# Patient Record
Sex: Female | Born: 1940 | Race: White | Hispanic: No | Marital: Married | Smoking: Never smoker
Health system: Southern US, Community
[De-identification: ages and names within clinical notes are randomized; demographics above are authoritative.]

## PROBLEM LIST (undated history)

## (undated) DIAGNOSIS — I10 Essential (primary) hypertension: Secondary | ICD-10-CM

## (undated) DIAGNOSIS — M1990 Unspecified osteoarthritis, unspecified site: Secondary | ICD-10-CM

## (undated) HISTORY — DX: Essential (primary) hypertension: I10

## (undated) HISTORY — DX: Unspecified osteoarthritis, unspecified site: M19.90

---

## 2005-07-08 ENCOUNTER — Encounter: Admission: RE | Admit: 2005-07-08 | Discharge: 2005-07-08 | Payer: Self-pay | Admitting: Orthopedic Surgery

## 2007-12-06 ENCOUNTER — Ambulatory Visit: Payer: Self-pay

## 2009-03-28 ENCOUNTER — Encounter: Admission: RE | Admit: 2009-03-28 | Discharge: 2009-03-28 | Payer: Self-pay | Admitting: Orthopedic Surgery

## 2014-12-05 ENCOUNTER — Ambulatory Visit (INDEPENDENT_AMBULATORY_CARE_PROVIDER_SITE_OTHER): Payer: Medicare Other

## 2014-12-05 VITALS — BP 133/64 | HR 96 | Resp 12

## 2014-12-05 DIAGNOSIS — M79673 Pain in unspecified foot: Secondary | ICD-10-CM

## 2014-12-05 DIAGNOSIS — R52 Pain, unspecified: Secondary | ICD-10-CM | POA: Diagnosis not present

## 2014-12-05 DIAGNOSIS — M7661 Achilles tendinitis, right leg: Secondary | ICD-10-CM

## 2014-12-05 DIAGNOSIS — M722 Plantar fascial fibromatosis: Secondary | ICD-10-CM | POA: Diagnosis not present

## 2014-12-05 DIAGNOSIS — M7730 Calcaneal spur, unspecified foot: Secondary | ICD-10-CM | POA: Diagnosis not present

## 2014-12-05 DIAGNOSIS — M7662 Achilles tendinitis, left leg: Secondary | ICD-10-CM | POA: Diagnosis not present

## 2014-12-05 MED ORDER — PREDNISONE 10 MG PO KIT: PACK | ORAL | Status: AC

## 2014-12-05 NOTE — Progress Notes (Signed)
   Subjective:    Patient ID: Elizabeth Kim, female    DOB: 04-17-1941, 74 y.o.   MRN: [id number]  HPI  PT STATED B/L BACK OF THE HEEL BEEN PAINFUL FOR 2 MONTHS. FEET ARE GETTING WORSE AND GET AGGRAVATED BY WALKING. TRIED TO WEAR GOOD SHOES BUT NO HELP.  Review of Systems  All other systems reviewed and are negative.      Objective:   Physical Exam 74 year old white female options this time with a recurrence or recalcitrant heel pain both inferior and posterior pain however on exam is the inferior pain from mid arch and medial calcaneal tubercle with most significant painful left more so than right. Has had orthotics for at least 3 or more years currently wearing orthotics in the pair of [employer] shoes that fit and contour well however the levels recently and has since developed posses compensatory Achilles tendinitis as well as pain along the medial band of the plantar fascial bilateral. Neurovascular status is intact pedal pulses are palpable DP and PT +2 over 4 bilateral Refill time 3 seconds. Epicritic and proprioceptive sensations appear to be intact and symmetric bilateral there is normal plantar response DTRs not listed dermatologically skin color pigment normal hair growth absent nails criptotic and incurvated orthopedic exam rectus foot type no fractures no cyst or tumors are noted x-rays reveal some mild inferior calcaneal spurring fascial thickening no signs of fracture or other osseous abnormality identified. Again does have a history of plantar fasciitis with exacerbation at this time.       Assessment & Plan:  Assessment plantar fasciitis/heel spur syndrome patient placed on a Sterapred Deas dosepak at this time both foot plantar fasciitis and secondary or compensatory Achilles tendinitis the gait changes. Fascial strapping applied to both feet recheck in 2 weeks for follow-up recommended ice and begin steroid Dosepak is issued  Alvan Dameichard Leovardo Thoman DPM

## 2014-12-05 NOTE — Patient Instructions (Signed)

## 2014-12-19 ENCOUNTER — Ambulatory Visit: Payer: Medicare Other

## 2015-09-23 DIAGNOSIS — J219 Acute bronchiolitis, unspecified: Secondary | ICD-10-CM | POA: Insufficient documentation

## 2015-10-06 DIAGNOSIS — K2900 Acute gastritis without bleeding: Secondary | ICD-10-CM | POA: Insufficient documentation

## 2015-11-27 DIAGNOSIS — I10 Essential (primary) hypertension: Secondary | ICD-10-CM | POA: Insufficient documentation

## 2015-11-27 DIAGNOSIS — K219 Gastro-esophageal reflux disease without esophagitis: Secondary | ICD-10-CM | POA: Insufficient documentation

## 2015-11-27 DIAGNOSIS — E559 Vitamin D deficiency, unspecified: Secondary | ICD-10-CM | POA: Insufficient documentation

## 2015-11-27 DIAGNOSIS — E785 Hyperlipidemia, unspecified: Secondary | ICD-10-CM | POA: Insufficient documentation

## 2016-02-27 ENCOUNTER — Encounter: Payer: Self-pay | Admitting: Podiatry

## 2016-02-27 ENCOUNTER — Ambulatory Visit (INDEPENDENT_AMBULATORY_CARE_PROVIDER_SITE_OTHER): Payer: Medicare Other | Admitting: Podiatry

## 2016-02-27 ENCOUNTER — Ambulatory Visit (INDEPENDENT_AMBULATORY_CARE_PROVIDER_SITE_OTHER): Payer: Medicare Other

## 2016-02-27 VITALS — BP 142/73 | HR 94 | Resp 18

## 2016-02-27 DIAGNOSIS — R52 Pain, unspecified: Secondary | ICD-10-CM

## 2016-02-27 DIAGNOSIS — M779 Enthesopathy, unspecified: Secondary | ICD-10-CM

## 2016-02-27 DIAGNOSIS — M216X2 Other acquired deformities of left foot: Secondary | ICD-10-CM

## 2016-02-27 DIAGNOSIS — M7742 Metatarsalgia, left foot: Secondary | ICD-10-CM | POA: Diagnosis not present

## 2016-02-27 DIAGNOSIS — M7741 Metatarsalgia, right foot: Secondary | ICD-10-CM

## 2016-03-01 NOTE — Progress Notes (Signed)
Patient ID: Marcy SalvoDianna C Hawkes, female   DOB: 1940-12-04, 75 y.o.   MRN: [id number]  Subjective: 75 year old female presents the office as concerns of pain to the ball of her foot which is been ongoing for 10 days his been worsening. She denies any recent injury or trauma to the left foot. She says that she has pain with weightbearing or pressure to her foot. Denies any warmth or redness although she does have a localized area of swelling to the ball of her foot for which she points to submetatarsal 3 and the left side. She did have an injury about 10 days ago in which she fell cutting her right leg [place] [facility] care for that with sutures were placed. She was on doxycycline. She states the redness to the area has improved. She's been following her primary care physician. She'll come back to the urgent care next week for suture removal. Denies any systemic complaints such as fevers, chills, nausea, vomiting. No acute changes since last appointment, and no other complaints at this time. Notingling to the toes.  Objective: AAO x3, NAD DP/PT pulses palpable bilaterally, CRT less than 3 seconds On the left foot submetatarsal 3 is left third interspaces tenderness and there is localized edema along submetatarsal 3. There is no pain in the dorsal aspect of the metatarsals. There is no pain with MPJ range of motion. No other areas of tenderness bilaterally. On the right posterior leg is Davis laceration with sutures intact. There is apparently resolving erythema around this lesion. No drainage or pus. No edema. No areas of pinpoint bony tenderness or pain with vibratory sensation. MMT 5/5, ROM WNL. No edema, erythema, increase in warmth to bilateral lower extremities.  No open lesions or pre-ulcerative lesions.  No pain with calf compression, swelling, warmth, erythema  Assessment: Left foot submetatarsal 3 pain likely due to capsulitis, metatarsalgia.  Plan: -All treatment options discussed with the patient  including all alternatives, risks, complications.  -X-rays were obtained and reviewed. No definitive evidence of acute fracture or stress fracture.-Discussed steroid injection for which she wishes to proceed with. Under sterile conditions a mixture of Kenalog as well as local anesthetic was infiltrated to the area of maximal tenderness without complications. Post injection care was discussed. -Offloading pads. -Ice. -Continue to follow-up with primary care physician/urgent care for right leg laceration. I discussed that if she wishes for me to remove the sutures next week we would happy to do so. Monitor for signs or symptoms of infection.  Ovid CurdMatthew Jakaiya Netherland, DPM

## 2016-03-22 ENCOUNTER — Ambulatory Visit: Payer: Medicare Other | Admitting: Podiatry

## 2016-03-22 ENCOUNTER — Encounter: Payer: Self-pay | Admitting: Podiatry

## 2016-03-22 ENCOUNTER — Ambulatory Visit (INDEPENDENT_AMBULATORY_CARE_PROVIDER_SITE_OTHER): Payer: Medicare Other | Admitting: Podiatry

## 2016-03-22 VITALS — BP 156/73 | HR 91 | Resp 18

## 2016-03-22 DIAGNOSIS — M7742 Metatarsalgia, left foot: Secondary | ICD-10-CM | POA: Diagnosis not present

## 2016-03-22 DIAGNOSIS — S81811S Laceration without foreign body, right lower leg, sequela: Secondary | ICD-10-CM | POA: Diagnosis not present

## 2016-03-22 DIAGNOSIS — M7741 Metatarsalgia, right foot: Secondary | ICD-10-CM

## 2016-03-22 DIAGNOSIS — M779 Enthesopathy, unspecified: Secondary | ICD-10-CM | POA: Diagnosis not present

## 2016-03-25 NOTE — Progress Notes (Signed)
Patient ID: Elizabeth Kim, female   DOB: September 23, 1941, 75 y.o.   MRN: [id number]  Subjective: 75 year old female presents the office in follow-up evaluation of the left foot pain. She states the left foot pain is doing substantially better compared to last appointment. She has developed some pain in the back of her left heel rubs in her shoes. Denies any recent injury. No swelling or redness. She also had a laceration the right leg for which she was seen at urgent care for. She states that the areas is not healing as she expected. She just finished a course of Bactrim and she had some posturing from the area while [place] is at the beach. Since taking the antibiotics she states the redness and the drainage has stopped. She still gets some discomfort to the incision at times. No other complaints.   Objective: AAO x3, NAD DP/PT pulses palpable bilaterally, CRT less than 3 seconds At this time there is no tenderness to palpation of the left foot submetatarsal area. No edema, erythema. There is mild discomfort along the posterior aspect of the calcaneus overlying the small bone spur. There is no edema, erythema. There is no pain on the course of the Achilles tendon was no defect noted within the tendon. Laceration present on the posterior aspect the left leg on the calf. Laceration appears to be healed except for the posterior portion. There is no drainage or pus. There is no induration. No swelling redness or red streaks. No areas of pinpoint bony tenderness or pain with vibratory sensation. MMT 5/5, ROM WNL. No edema, erythema, increase in warmth to bilateral lower extremities.  No open lesions or pre-ulcerative lesions.  No pain with calf compression, swelling, warmth, erythema  Assessment: Laceration left leg, resolved forefoot pain left foot, left posterior calcaneal bursitis  Plan: -All treatment options discussed with the patient including all alternatives, risks, complications.  -For the left leg  recommended antibiotic ointment dressing changes daily. Currently there is no clinical signs of infection and will hold off on further antibiotics. If not healed in 2 weeks to call the office. -Left forefoot pain resolved. Discussed offloading to help prevent recurrence -Posterior gel sleeve applied for the bone spur. Stretching exercises. Achilles. Discussed shoe gear changes. -Patient encouraged to call the office with any questions, concerns, change in symptoms.   Ovid CurdMatthew Wagoner, DPM

## 2016-05-03 ENCOUNTER — Ambulatory Visit: Payer: Medicare Other | Admitting: Podiatry

## 2016-07-07 DIAGNOSIS — E669 Obesity, unspecified: Secondary | ICD-10-CM | POA: Insufficient documentation

## 2016-07-07 DIAGNOSIS — F3342 Major depressive disorder, recurrent, in full remission: Secondary | ICD-10-CM | POA: Insufficient documentation

## 2016-07-07 DIAGNOSIS — Z13820 Encounter for screening for osteoporosis: Secondary | ICD-10-CM | POA: Insufficient documentation

## 2016-08-04 ENCOUNTER — Ambulatory Visit (INDEPENDENT_AMBULATORY_CARE_PROVIDER_SITE_OTHER): Payer: Medicare Other | Admitting: Physical Medicine and Rehabilitation

## 2016-08-19 ENCOUNTER — Ambulatory Visit (INDEPENDENT_AMBULATORY_CARE_PROVIDER_SITE_OTHER): Payer: Medicare Other | Admitting: Physical Medicine and Rehabilitation

## 2016-08-19 ENCOUNTER — Encounter (INDEPENDENT_AMBULATORY_CARE_PROVIDER_SITE_OTHER): Payer: Self-pay | Admitting: Physical Medicine and Rehabilitation

## 2016-08-19 VITALS — BP 177/82 | HR 79

## 2016-08-19 DIAGNOSIS — M5416 Radiculopathy, lumbar region: Secondary | ICD-10-CM | POA: Diagnosis not present

## 2016-08-19 DIAGNOSIS — M961 Postlaminectomy syndrome, not elsewhere classified: Secondary | ICD-10-CM | POA: Diagnosis not present

## 2016-08-19 NOTE — Progress Notes (Signed)
Office Visit Note   Patient: Elizabeth Kim           Date of Birth: [birth year]           MRN: [id number] Visit Date: 08/19/2016              Requested by: Elizabeth EvesYuri M. Luiz Ironabeza, Elizabeth Kim [address] [place], [place] [id number] PCP: Elizabeth Kim,YURI, Elizabeth Kim   Assessment & Plan: Visit Diagnoses:  1. Post laminectomy syndrome   2. Lumbar radiculopathy    Chronic history of neck and low back pain with prior lumbar lateral fusion which was done a long time ago. She has had an exacerbation now right radicular leg pain and some bursitis of the right greater trochanter. I think there predominantly her pain as an L4-L5 distribution. There is really no other red flag complaints or symptoms. She has good distal strength. I think the best approach would be a diagnostic L4-L5 transforaminal injection. To do that weaning her off her Plavix for 5-7 days. Will get clearance for that. She's done well in the past with epidural injections. She can't take anti-inflammatories due to Plavix anticoagulation. Depending on the relief with that we would updated MRI if a distant help very much versus looking at a greater trochanteric injection. We've also given her some home exercises as well as using a rolling pin for massaging the tensor fascia lata and IT band. Plan: We will schedule her for a right L4-L5 transforaminal epidural injection once we have glanced off Plavix. This would be diagnostic and hopefully therapeutic.  Follow-Up Instructions: Return for Schedule Right L5 transforaminal esi off plaviz.   Orders:  No orders of the defined types were placed in this encounter.  No orders of the defined types were placed in this encounter.     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 She has a transitional segment with a lumbarized S1. She had some foraminal narrowing at L4. Facet arthropathy  at L3-4.    Subjective: Chief Complaint  Patient presents with  . Left Hip - Pain    Hip Pain   The pain is present in the right hip, right thigh and right knee. The pain is at a severity of 8/10. The pain has been constant since onset. The symptoms are aggravated by weight bearing and movement. She has tried acetaminophen for the symptoms. The treatment provided mild relief.  Elizabeth Kim is a 75 year old female that we seen on numerous occasions but I have not seen her since December last year. At that point we completed a facet joint block above her fusion with good relief of some axial low back pain. She now comes in having had right hip and leg pain since September. Thinks she may have aggravated it lifting something. Worse with standing. Pain goes from right buttock down past right knee. Denies groin pain. Denies numbness and tingling. She does describe this as a deep aching pain. She does have some pain over the greater trochanter. She is able to sleep at night. She is taking Tylenol. She cannot take any anti-inflammatories due to anticoagulants.  She was walking for exercise but has not been able to walk since September.  Review of Systems  Constitutional: Negative for chills, fatigue, fever and unexpected weight change.  HENT: Negative for sore throat and trouble swallowing.   Eyes: Negative for photophobia  and visual disturbance.  Respiratory: Negative for chest tightness and shortness of breath.   Cardiovascular: Negative for chest pain.  Gastrointestinal: Negative for abdominal pain.  Endocrine: Negative for cold intolerance and heat intolerance.  Musculoskeletal: Negative for myalgias.  Skin: Negative for color change and rash.  Neurological: Negative for speech difficulty and headaches.  Psychiatric/Behavioral: Negative for confusion. The patient is not nervous/anxious.   All other systems reviewed and are negative.    Objective: Vital Signs: BP (!) 177/82   Pulse 79    Physical Exam  Constitutional: She is oriented to person, place, and time. She appears well-developed and well-nourished. No distress.  HENT:  Head: Normocephalic and atraumatic.  Nose: Nose normal.  Mouth/Throat: Oropharynx is clear and moist.  Eyes: Conjunctivae are normal. Pupils are equal, round, and reactive to light.  Neck: Normal range of motion. Neck supple.  Cardiovascular: Regular rhythm and intact distal pulses.   Pulmonary/Chest: Effort normal and breath sounds normal.  Abdominal: She exhibits no distension.  Neurological: She is alert and oriented to person, place, and time.  Skin: Skin is warm.  Psychiatric: She has a normal mood and affect. Her behavior is normal.  Nursing note and vitals reviewed.  The patient ambulates without aid with a normal gait.  They have no pain with hip internal or external rotation.  There is concordant low back pain with extension rotation of the lumbar spine. They have  pain over the greater trochanters and PSIS.  On manual muscle testing there is 5/5 strength in all muscle groups of the lower extremities bilaterally without deficits.   There is no clonus bilaterally.  Slump test is negative bilaterally.  Ortho Exam  Specialty Comments:  No specialty comments available.  Imaging: No results found.   PMFS History: There are no active problems to display for this patient.  Past Medical History:  Diagnosis Date  . Arthritis   . Hypertension     History reviewed. No pertinent family history.  History reviewed. No pertinent surgical history. Social History   Occupational History  . Not on file.   Social History Main Topics  . Smoking status: Never Smoker  . Smokeless tobacco: Never Used  . Alcohol use No  . Drug use: No  . Sexual activity: Not on file

## 2016-08-31 ENCOUNTER — Ambulatory Visit (INDEPENDENT_AMBULATORY_CARE_PROVIDER_SITE_OTHER): Payer: Medicare Other | Admitting: Physical Medicine and Rehabilitation

## 2016-09-01 ENCOUNTER — Encounter (INDEPENDENT_AMBULATORY_CARE_PROVIDER_SITE_OTHER): Payer: Self-pay | Admitting: Physical Medicine and Rehabilitation

## 2016-09-01 ENCOUNTER — Ambulatory Visit (INDEPENDENT_AMBULATORY_CARE_PROVIDER_SITE_OTHER): Payer: Medicare Other | Admitting: Physical Medicine and Rehabilitation

## 2016-09-01 VITALS — BP 166/78 | HR 64 | Temp 98.3°F

## 2016-09-01 DIAGNOSIS — M5416 Radiculopathy, lumbar region: Secondary | ICD-10-CM | POA: Diagnosis not present

## 2016-09-01 DIAGNOSIS — M961 Postlaminectomy syndrome, not elsewhere classified: Secondary | ICD-10-CM | POA: Insufficient documentation

## 2016-09-01 MED ORDER — LIDOCAINE HCL (PF) 1 % IJ SOLN
0.3300 mL | Freq: Once | INTRAMUSCULAR | Status: AC
  Administered 2016-09-01: 0.3 mL

## 2016-09-01 MED ORDER — METHYLPREDNISOLONE ACETATE 80 MG/ML IJ SUSP
80.0000 mg | Freq: Once | INTRAMUSCULAR | Status: AC
  Administered 2016-09-01: 80 mg

## 2016-09-01 NOTE — Procedures (Signed)
Lumbosacral Transforaminal Epidural Steroid Injection - Infraneural Approach with Fluoroscopic Guidance  Patient: Elizabeth Kim      Date of Birth: 04-Nov-1940 MRN: [id number] PCP: Dennis BastABEZA,YURI, MD      Visit Date: 09/01/2016   Universal Protocol:    Date/Time: 11/15/172:51 PM  Consent Given By: the patient  Position: PRONE   Additional Comments: Vital signs were monitored before and after the procedure. Patient was prepped and draped in the usual sterile fashion. The correct patient, procedure, and site was verified.   Injection Procedure Details:  Procedure Site One Meds Administered:  Meds ordered this encounter  Medications  . lidocaine (PF) (XYLOCAINE) 1 % injection 0.3 mL  . methylPREDNISolone acetate (DEPO-MEDROL) injection 80 mg      Laterality: Right  Location/Site:  L4-L5  Needle size: 22 G  Needle type: Spinal  Needle Placement: Transforaminal  Findings:  -Contrast Used: 1 mL iohexol 180 mg iodine/mL   -Comments: Excellent flow of contrast along the nerve and into the epidural space.  Procedure Details: After squaring off the end-plates of the desired vertebral level to get a true AP view, the C-arm was obliqued to the painful side so that the superior articulating process is positioned about 1/3 the length of the inferior endplate.  The needle was aimed toward the junction of the superior articular process and the transverse process of the inferior vertebrae. The needle's initial entry is in the lower third of the foramen through Kambin's triangle. The soft tissues overlying this target were infiltrated with 2-3 ml. of 1% Lidocaine without Epinephrine.  The spinal needle was then inserted and advanced toward the target using a "trajectory" view along the fluoroscope beam.  Under AP and lateral visualization, the needle was advanced so it did not puncture dura and did not traverse medially beyond the 6 o'clock position of the pedicle. Bi-planar projections  were used to confirm position. Aspiration was confirmed to be negative for CSF and/or blood. A 1-2 ml. volume of Isovue-250 was injected and flow of contrast was noted at each level. Radiographs were obtained for documentation purposes.   After attaining the desired flow of contrast documented above, a 0.5 to 1.0 ml test dose of 0.25% Marcaine was injected into each respective transforaminal space.  The patient was observed for 90 seconds post injection.  After no sensory deficits were reported, and normal lower extremity motor function was noted,   the above injectate was administered so that equal amounts of the injectate were placed at each foramen (level) into the transforaminal epidural space.   Additional Comments:  The patient tolerated the procedure well Dressing: Band-Aid    Post-procedure details: Patient was observed during the procedure. Post-procedure instructions were reviewed.  Patient left the clinic in stable condition.

## 2016-09-01 NOTE — Patient Instructions (Signed)

## 2016-09-01 NOTE — Progress Notes (Signed)
Elizabeth SalvoDianna C Sheffler - 75 y.o. female MRN [id number]  Date of birth: 23-May-1941  Office Visit Note: Visit Date: 09/01/2016 PCP: Dennis BastABEZA,YURI, MD Referred by: Andreas Blowerabeza, Yuri M., MD  Subjective: Chief Complaint  Patient presents with  . Lower Back - Pain   HPI: Elizabeth Kim is a 75 year old female with right buttock and posterior lateral thigh pain down to the knee. His more of an L4-L5 distribution. Patient here today for planned right L4-5 transforaminal injection. No change in symptoms.  Stopped taking Plavix on 08/24/16    ROS Otherwise per HPI.  Assessment & Plan: Visit Diagnoses:  1. Lumbar radiculopathy   2. Post laminectomy syndrome     Plan: Findings:  I am going to complete a right L4 transforaminal injection. She's had prior lateral fusion at L5-S1. She has an S1 lumbar last segment. She'll return to taking her Plavix in the morning.    Meds & Orders:  Meds ordered this encounter  Medications  . lidocaine (PF) (XYLOCAINE) 1 % injection 0.3 mL  . methylPREDNISolone acetate (DEPO-MEDROL) injection 80 mg    Orders Placed This Encounter  Procedures  . Epidural Steroid injection    Follow-up: Return if symptoms worsen or fail to improve, for ? new Lspine MRI.   Procedures: No procedures performed  Lumbosacral Transforaminal Epidural Steroid Injection - Infraneural Approach with Fluoroscopic Guidance  Patient: Elizabeth Kim      Date of Birth: 23-May-1941 MRN: [id number] PCP: Dennis BastABEZA,YURI, MD      Visit Date: 09/01/2016   Universal Protocol:    Date/Time: 11/15/172:51 PM  Consent Given By: the patient  Position: PRONE   Additional Comments: Vital signs were monitored before and after the procedure. Patient was prepped and draped in the usual sterile fashion. The correct patient, procedure, and site was verified.   Injection Procedure Details:  Procedure Site One Meds Administered:  Meds ordered this encounter  Medications  . lidocaine (PF) (XYLOCAINE) 1 %  injection 0.3 mL  . methylPREDNISolone acetate (DEPO-MEDROL) injection 80 mg      Laterality: Right  Location/Site:  L4-L5  Needle size: 22 G  Needle type: Spinal  Needle Placement: Transforaminal  Findings:  -Contrast Used: 1 mL iohexol 180 mg iodine/mL   -Comments: Excellent flow of contrast along the nerve and into the epidural space.  Procedure Details: After squaring off the end-plates of the desired vertebral level to get a true AP view, the C-arm was obliqued to the painful side so that the superior articulating process is positioned about 1/3 the length of the inferior endplate.  The needle was aimed toward the junction of the superior articular process and the transverse process of the inferior vertebrae. The needle's initial entry is in the lower third of the foramen through Kambin's triangle. The soft tissues overlying this target were infiltrated with 2-3 ml. of 1% Lidocaine without Epinephrine.  The spinal needle was then inserted and advanced toward the target using a "trajectory" view along the fluoroscope beam.  Under AP and lateral visualization, the needle was advanced so it did not puncture dura and did not traverse medially beyond the 6 o'clock position of the pedicle. Bi-planar projections were used to confirm position. Aspiration was confirmed to be negative for CSF and/or blood. A 1-2 ml. volume of Isovue-250 was injected and flow of contrast was noted at each level. Radiographs were obtained for documentation purposes.   After attaining the desired flow of contrast documented above, a 0.5 to 1.0 ml test dose  of 0.25% Marcaine was injected into each respective transforaminal space.  The patient was observed for 90 seconds post injection.  After no sensory deficits were reported, and normal lower extremity motor function was noted,   the above injectate was administered so that equal amounts of the injectate were placed at each foramen (level) into the transforaminal  epidural space.   Additional Comments:  The patient tolerated the procedure well Dressing: Band-Aid    Post-procedure details: Patient was observed during the procedure. Post-procedure instructions were reviewed.  Patient left the clinic in stable condition.   Clinical History: No specialty comments available.  She reports that she has never smoked. She has never used smokeless tobacco. No results for input(s): HGBA1C, LABURIC in the last 8760 hours.  Objective:  VS:  HT:    WT:   BMI:     BP:(!) 166/78  HR:64bpm  TEMP:98.3 F (36.8 C)(Oral)  RESP:96 % Physical Exam  Musculoskeletal:  The patient ambulates without aid and has good distal strength bilaterally without deficits.    Ortho Exam Imaging: No results found.  Past Medical/Family/Surgical/Social History: Medications & Allergies reviewed per EMR Patient Active Problem List   Diagnosis Date Noted  . Lumbar radiculopathy 09/01/2016  . Post laminectomy syndrome 09/01/2016   Past Medical History:  Diagnosis Date  . Arthritis   . Hypertension    History reviewed. No pertinent family history. History reviewed. No pertinent surgical history. Social History   Occupational History  . Not on file.   Social History Main Topics  . Smoking status: Never Smoker  . Smokeless tobacco: Never Used  . Alcohol use No  . Drug use: No  . Sexual activity: Not on file

## 2017-04-04 DIAGNOSIS — G43009 Migraine without aura, not intractable, without status migrainosus: Secondary | ICD-10-CM | POA: Insufficient documentation

## 2017-08-23 ENCOUNTER — Telehealth (INDEPENDENT_AMBULATORY_CARE_PROVIDER_SITE_OTHER): Payer: Self-pay | Admitting: Physical Medicine and Rehabilitation

## 2017-08-23 NOTE — Telephone Encounter (Signed)
Repeat ok vs. bilateral

## 2017-08-24 NOTE — Telephone Encounter (Signed)
Patient said pain is different than what she has had in the past and is on the left side. I scheduled her for an ov.

## 2017-08-25 ENCOUNTER — Encounter (INDEPENDENT_AMBULATORY_CARE_PROVIDER_SITE_OTHER): Payer: Self-pay | Admitting: Physical Medicine and Rehabilitation

## 2017-08-25 ENCOUNTER — Ambulatory Visit (INDEPENDENT_AMBULATORY_CARE_PROVIDER_SITE_OTHER): Payer: Medicare Other | Admitting: Physical Medicine and Rehabilitation

## 2017-08-25 VITALS — BP 150/74 | HR 72

## 2017-08-25 DIAGNOSIS — M5442 Lumbago with sciatica, left side: Secondary | ICD-10-CM

## 2017-08-25 DIAGNOSIS — M961 Postlaminectomy syndrome, not elsewhere classified: Secondary | ICD-10-CM | POA: Diagnosis not present

## 2017-08-25 DIAGNOSIS — R109 Unspecified abdominal pain: Secondary | ICD-10-CM | POA: Insufficient documentation

## 2017-08-25 MED ORDER — METHYLPREDNISOLONE ACETATE 80 MG/ML IJ SUSP
40.0000 mg | Freq: Once | INTRAMUSCULAR | Status: AC
  Administered 2017-08-25: 40 mg via INTRAMUSCULAR

## 2017-08-25 NOTE — Procedures (Signed)
Trigger Point Injection  Patient: Elizabeth Kim      Date of Birth: 12-31-1940 MRN: [id number] PCP: Andreas Blowerabeza, Yuri M., MD      Visit Date: 08/25/2017   Universal Protocol:    Date/Time: 11/08/189:24 AM  Consent Given By: Patient  Additional Comments:  Patient was prepped and draped in the usual sterile fashion. The correct patient, procedure, and site was verified.   Injection Procedure Details:  Procedure Site One Meds Administered:  Meds ordered this encounter  Medications  . methylPREDNISolone acetate (DEPO-MEDROL) injection 40 mg     Needle size: 25 gauge  Needle Placement: Intramuscular  Procedure Details: A  needle was introduced with an appropriate approach into the palpated trigger points of the muscles noted above in body of note .  After confirmation of negative blood and air aspiration, the above solution was injected freely without resistance and a needling technique was utilized.   Additional Comments:   Patient tolerated procedure well without complication.  Dressing: Band-aid  Post-procedure details: Patient was observed during the procedure. Post-procedure instructions were reviewed.  Patient left the clinic in stable condition.

## 2017-08-25 NOTE — Progress Notes (Deleted)
Burning pain on left side of lower back with sitting, standing. Started about 2 weeks ago.  Had some left leg pain recently.

## 2017-08-25 NOTE — Progress Notes (Signed)
Marcy SalvoDianna C Ricchio - 76 y.o. female MRN [id number]  Date of birth: 05-Feb-1941  Office Visit Note: Visit Date: 08/25/2017 PCP: Andreas Blowerabeza, Yuri M., MD Referred by: Andreas Blowerabeza, Yuri M., MD  Subjective: Chief Complaint  Patient presents with  . Lower Back - Pain, Numbness  . Right Hip - Numbness  . Right Leg - Pain   HPI: Mrs. Tiburcio PeaHarris is a very pleasant 76 year old female that I have seen over intermittent epidural injection usually with more leg pain.  Her history is that she has had a remote L5-S1 lateral fusion surgery.  She has a transitional S1 segment which is lumbarized.  She comes in today with burning pain on the left side of the lower back with both sitting and standing.  Is been going on for a couple of weeks with severity but really over the last several months it has been ongoing.  No specific falls.  She has had some leg pain recently but not severe.  No numbness tingling or paresthesia.  No focal weakness.  Last time we saw her was about a year ago in November.  At that time we did complete epidural injection with decent relief.  Last MRI was from 2006 and is reviewed again below.  She has had no fevers chills or night sweats.  No bowel or bladder changes.  She has had no unintended weight loss.  She is on chronic anticoagulation.  Her case is complicated by history of depression and anxiety.  She has been taking recently Tylenol and some pain medication.  Symptoms are worse with both standing and sitting.  Gets a little bit of relief walking but cannot walk far without it hurting.  Standing is fairly problematic.  She reports that the symptoms are progressive.    Review of Systems  Constitutional: Negative for chills, fever, malaise/fatigue and weight loss.  HENT: Negative for hearing loss and sinus pain.   Eyes: Negative for blurred vision, double vision and photophobia.  Respiratory: Negative for cough and shortness of breath.   Cardiovascular: Negative for chest pain, palpitations and leg  swelling.  Gastrointestinal: Negative for abdominal pain, nausea and vomiting.  Genitourinary: Negative for flank pain.  Musculoskeletal: Positive for back pain and joint pain. Negative for myalgias.  Skin: Negative for itching and rash.  Neurological: Negative for tremors, focal weakness and weakness.  Endo/Heme/Allergies: Negative.   Psychiatric/Behavioral: Negative for depression.  All other systems reviewed and are negative.  Otherwise per HPI.  Assessment & Plan: Visit Diagnoses:  1. Acute left-sided low back pain with left-sided sciatica   2. Post laminectomy syndrome     Plan: Findings:  Chronic history of low back pain status post remote lumbar fusion without instrumentation.  This was a lateral fusion.  She also has a lumbarized S1 segment below the fusion.  Prior MRI was from 2006.  Her current pain is a mixture of chronic on acute.  She does have a focal trigger point in the quadratus lumborum area and possibly where the latissimus dorsi ties into the lower back.  This did reproduce some of her pain.  Trigger point injection was provided.  I do want to update her MRI before we choose to go any further with intervention or treatment.  The MRI would be to look at any adjacent level worsening over the years since 2006.  We only see her about once a year for follow-up usually an injection seems to help or at least this is reported by the patient.  She has no red flag symptoms and no strength loss on exam.    Meds & Orders:  Meds ordered this encounter  Medications  . methylPREDNISolone acetate (DEPO-MEDROL) injection 40 mg    Orders Placed This Encounter  Procedures  . Nerve Block  . MR LUMBAR SPINE WO CONTRAST    Follow-up: Return for MRI review after completion.   Procedures: No procedures performed  Trigger Point Injection  Patient: Marcy SalvoDianna C Fatula      Date of Birth: 1941-07-01 MRN: [id number] PCP: Andreas Blowerabeza, Yuri M., MD      Visit Date: 08/25/2017   Universal  Protocol:    Date/Time: 11/08/189:24 AM  Consent Given By: Patient  Additional Comments:  Patient was prepped and draped in the usual sterile fashion. The correct patient, procedure, and site was verified.   Injection Procedure Details:  Procedure Site One Meds Administered:  Meds ordered this encounter  Medications  . methylPREDNISolone acetate (DEPO-MEDROL) injection 40 mg     Needle size: 25 gauge  Needle Placement: Intramuscular  Procedure Details: A  needle was introduced with an appropriate approach into the palpated trigger points of the muscles noted above in body of note .  After confirmation of negative blood and air aspiration, the above solution was injected freely without resistance and a needling technique was utilized.   Additional Comments:   Patient tolerated procedure well without complication.  Dressing: Band-aid  Post-procedure details: Patient was observed during the procedure. Post-procedure instructions were reviewed.  Patient left the clinic in stable condition.   Clinical History: No specialty comments available.  She reports that  has never smoked. she has never used smokeless tobacco. No results for input(s): HGBA1C, LABURIC in the last 8760 hours.  Objective:  VS:  HT:    WT:   BMI:     BP:(!) 150/74  HR:72bpm  TEMP: ( )  RESP:  Physical Exam  Constitutional: She is oriented to person, place, and time. She appears well-developed and well-nourished. No distress.  HENT:  Head: Normocephalic and atraumatic.  Nose: Nose normal.  Mouth/Throat: Oropharynx is clear and moist.  Eyes: Conjunctivae are normal. Pupils are equal, round, and reactive to light.  Neck: Normal range of motion. Neck supple.  Cardiovascular: Regular rhythm and intact distal pulses.  Pulmonary/Chest: Effort normal. No respiratory distress.  Abdominal: She exhibits no distension. There is no guarding.  Musculoskeletal:  Patient walks with a forward flexed spine.   She has stiffness and pain with extension rotation of the lumbar spine.  There is a focal trigger point on the left which does reproduce some of her pain.  It is in the quadratus lumborum area just medial to the left iliac crest about the L4 level.  She has no pain with hip rotation.  She has good distal strength without clonus.  Neurological: She is alert and oriented to person, place, and time. She exhibits normal muscle tone. Coordination normal.  Skin: Skin is warm. No rash noted. No erythema.  Psychiatric: She has a normal mood and affect. Her behavior is normal.  Nursing note and vitals reviewed.   Ortho Exam Imaging: No results found.  Past Medical/Family/Surgical/Social History: Medications & Allergies reviewed per EMR Patient Active Problem List   Diagnosis Date Noted  . Left sided abdominal pain 08/25/2017  . Migraine without aura and without status migrainosus, not intractable 04/04/2017  . Lumbar radiculopathy 09/01/2016  . Post laminectomy syndrome 09/01/2016  . Osteoporosis screening 07/07/2016  . Obesity (BMI 30.0-34.9) 07/07/2016  .  Recurrent major depressive disorder, in full remission (HCC) 07/07/2016  . Gastroesophageal reflux disease without esophagitis 11/27/2015  . Hyperlipidemia LDL goal <130 11/27/2015  . Vitamin D deficiency 11/27/2015  . Acute gastritis 10/06/2015  . Acute bronchiolitis with bronchospasm 09/23/2015   Past Medical History:  Diagnosis Date  . Arthritis   . Hypertension    History reviewed. No pertinent family history. History reviewed. No pertinent surgical history. Social History   Occupational History  . Not on file  Tobacco Use  . Smoking status: Never Smoker  . Smokeless tobacco: Never Used  Substance and Sexual Activity  . Alcohol use: No    Alcohol/week: 0.0 oz  . Drug use: No  . Sexual activity: Not on file

## 2017-09-01 ENCOUNTER — Encounter (INDEPENDENT_AMBULATORY_CARE_PROVIDER_SITE_OTHER): Payer: Self-pay | Admitting: Physical Medicine and Rehabilitation

## 2017-09-21 ENCOUNTER — Ambulatory Visit
Admission: RE | Admit: 2017-09-21 | Discharge: 2017-09-21 | Disposition: A | Discharge status: Home / Self Care | Payer: Medicare Other | Source: Ambulatory Visit | Attending: Physical Medicine and Rehabilitation | Admitting: Physical Medicine and Rehabilitation

## 2017-09-21 DIAGNOSIS — M961 Postlaminectomy syndrome, not elsewhere classified: Secondary | ICD-10-CM

## 2017-09-21 DIAGNOSIS — M5442 Lumbago with sciatica, left side: Secondary | ICD-10-CM

## 2017-09-29 ENCOUNTER — Encounter (INDEPENDENT_AMBULATORY_CARE_PROVIDER_SITE_OTHER): Payer: Self-pay | Admitting: Physical Medicine and Rehabilitation

## 2017-09-29 ENCOUNTER — Ambulatory Visit (INDEPENDENT_AMBULATORY_CARE_PROVIDER_SITE_OTHER): Payer: Medicare Other | Admitting: Physical Medicine and Rehabilitation

## 2017-09-29 VITALS — BP 158/72 | HR 73

## 2017-09-29 DIAGNOSIS — G8929 Other chronic pain: Secondary | ICD-10-CM

## 2017-09-29 DIAGNOSIS — M545 Low back pain, unspecified: Secondary | ICD-10-CM

## 2017-09-29 DIAGNOSIS — M47816 Spondylosis without myelopathy or radiculopathy, lumbar region: Secondary | ICD-10-CM | POA: Diagnosis not present

## 2017-09-29 DIAGNOSIS — M961 Postlaminectomy syndrome, not elsewhere classified: Secondary | ICD-10-CM

## 2017-09-29 NOTE — Progress Notes (Deleted)
Here for MRI review. Pain has improved. Still "catches" when standing for an extended period and "takes her breath."

## 2017-10-31 ENCOUNTER — Encounter (INDEPENDENT_AMBULATORY_CARE_PROVIDER_SITE_OTHER): Payer: Self-pay | Admitting: Physical Medicine and Rehabilitation

## 2017-10-31 ENCOUNTER — Ambulatory Visit (INDEPENDENT_AMBULATORY_CARE_PROVIDER_SITE_OTHER): Payer: Medicare Other

## 2017-10-31 ENCOUNTER — Ambulatory Visit (INDEPENDENT_AMBULATORY_CARE_PROVIDER_SITE_OTHER): Payer: Medicare Other | Admitting: Physical Medicine and Rehabilitation

## 2017-10-31 VITALS — BP 153/69 | HR 68 | Temp 97.8°F

## 2017-10-31 DIAGNOSIS — G8929 Other chronic pain: Secondary | ICD-10-CM | POA: Diagnosis not present

## 2017-10-31 DIAGNOSIS — M545 Low back pain, unspecified: Secondary | ICD-10-CM

## 2017-10-31 DIAGNOSIS — M961 Postlaminectomy syndrome, not elsewhere classified: Secondary | ICD-10-CM | POA: Diagnosis not present

## 2017-10-31 DIAGNOSIS — M47816 Spondylosis without myelopathy or radiculopathy, lumbar region: Secondary | ICD-10-CM

## 2017-10-31 MED ORDER — METHYLPREDNISOLONE ACETATE 80 MG/ML IJ SUSP
80.0000 mg | Freq: Once | INTRAMUSCULAR | Status: AC
  Administered 2017-10-31: 80 mg

## 2017-10-31 MED ORDER — METHYLPREDNISOLONE ACETATE 80 MG/ML IJ SUSP: 80.0000 mg | Freq: Once | INTRAMUSCULAR | Status: DC

## 2017-10-31 NOTE — Procedures (Signed)
Elizabeth Kim comes in today for planned bilateral L4-5 facet joint injections.  Please see our prior evaluation notes for further details and justification.  She is having mostly axial low back pain bilateral left more than right.  She has had fusion at L5-S1 with grade 2-3 listhesis at this level.  She has a transitional S1 segment.  Lumbar Facet Joint Intra-Articular Injection(s) with Fluoroscopic Guidance  Patient: Elizabeth Kim      Date of Birth: 1941/06/11 MRN: [id number] PCP: Andreas Blower., MD      Visit Date: 10/31/2017   Universal Protocol:    Date/Time: 10/31/2017  Consent Given By: the patient  Position: PRONE   Additional Comments: Vital signs were monitored before and after the procedure. Patient was prepped and draped in the usual sterile fashion. The correct patient, procedure, and site was verified.   Injection Procedure Details:  Procedure Site One Meds Administered:  Meds ordered this encounter  Medications  . DISCONTD: methylPREDNISolone acetate (DEPO-MEDROL) injection 80 mg  . methylPREDNISolone acetate (DEPO-MEDROL) injection 80 mg     Laterality: Bilateral  Location/Site:  Based on transitional S1 segment. L4-L5  Needle size: 22 guage  Needle type: Spinal  Needle Placement: Articular  Findings:  -Comments: Excellent flow of contrast producing a partial arthrogram.  Procedure Details: The fluoroscope beam is vertically oriented in AP, and the inferior recess is visualized beneath the lower pole of the inferior apophyseal process, which represents the target point for needle insertion. When direct visualization is difficult the target point is located at the medial projection of the vertebral pedicle. The region overlying each aforementioned target is locally anesthetized with a 1 to 2 ml. volume of 1% Lidocaine without Epinephrine.   The spinal needle was inserted into each of the above mentioned facet joints using biplanar fluoroscopic guidance.  A 0.25 to 0.5 ml. volume of Isovue-250 was injected and a partial facet joint arthrogram was obtained. A single spot film was obtained of the resulting arthrogram.    One to 1.25 ml of the steroid/anesthetic solution was then injected into each of the facet joints noted above.   Additional Comments:  The patient tolerated the procedure well Dressing: Band-Aid    Post-procedure details: Patient was observed during the procedure. Post-procedure instructions were reviewed.  Patient left the clinic in stable condition.  Pertinent Imaging: MRI LUMBAR SPINE WITHOUT CONTRAST  TECHNIQUE: Multiplanar, multisequence MR imaging of the lumbar spine was performed. No intravenous contrast was administered.  COMPARISON:  Abdominal CT 12/15/2016  FINDINGS: Segmentation:  Transitional S1 vertebra based on the lowest ribs.  Alignment: Grade 2/3 anterolisthesis at L5-S1 where there was either severe facet arthropathy or chronic pars defects, status post posterior decompression and posterior fusion.  Vertebrae: No fracture, evidence of discitis, or bone lesion. Bone harvest site in the posterior left ilium that is partially covered.  Conus medullaris and cauda equina: Conus extends to the L1 level. Conus and cauda equina appear normal.  Paraspinal and other soft tissues: Postoperative fatty atrophy of back muscles at L4 and L5.  Disc levels:  T12- L1: Unremarkable.  L1-L2: Borderline facet spurring.  Negative disc.  L2-L3: Mild disc narrowing and bulging. Mild facet spurring. No impingement  L3-L4: Moderate facet arthropathy with joint distortion and trace joint fluid. Mild disc bulging. Mild spinal stenosis.  L4-L5: Facet arthropathy with severe joint distortion. Both facets are gaping with effusion. The disc is narrowed and bulging. There is a synovial cyst in the upper left foramen measuring  16 mm, with left foraminal impingement exacerbated by disc bulging.  Distorted but patent spinal canal.  L5-S1:Posterior decompression. Fusion of anterolisthesis. The foramina are distorted there is bilateral L5 flattening and atrophy.  IMPRESSION: 1. L5-S1 grade 2/3 anterolisthesis with posterolateral solid arthrodesis and interbody ankylosis. Bilateral foraminal stenosis with flattening of the atrophic L5 nerve roots. Patent spinal canal after posterior decompression. 2. L4-5 severe facet arthropathy with effusions in the gaping joints. Disc narrowing and bulging causes foraminal stenosis, accentuated on the left by a 8 mm synovial cyst. 3. L3-4 moderate facet arthropathy.   Electronically Signed   By: Marnee SpringJonathon  Watts M.D.   On: 09/21/2017 14:33

## 2017-10-31 NOTE — Patient Instructions (Signed)

## 2017-10-31 NOTE — Progress Notes (Signed)
Elizabeth Kim - 77 y.o. female MRN [id number]  Date of birth: [birth year]  Office Visit Note: Visit Date: 09/29/2017 PCP: Andreas Blower., MD Referred by: Andreas Blower., MD  Subjective: Chief Complaint  Patient presents with  . Lower Back - Pain   HPI: Elizabeth Kim is a 77 year old female with chronic history of back pain.  She has a history of lateral fusion at L5-S1 with a grade 2 to almost grade 3 listhesis at this level.  She has had mainly issues with leg pain in the past and epidural injections of helped her to a degree and I see her very rarely.  We saw her several months ago with worsening left-sided axial low back pain that seem to be more facet mediated pain.  We completed trigger point injection which was only minimally beneficial.  We did obtain an MRI of the lumbar spine since she has not had an MRI since 2006.  This is reviewed with her today and reviewed below in the notes.  He continues to have catching type sensation in the lower back left more than right but bilateral.  He says it will take her breath away when it happens and it seems to cause a spasm extended period of time where it severe.  Denies any radicular leg pain.  Medications have not been helpful.  He has not had recent therapy but has had physical therapy in the past and continues to try to stay active.  She actually does well from a functional standpoint given the issues with her lumbar spine.  Her case is complicated by depression and anxiety.    Review of Systems  Constitutional: Negative for chills, fever, malaise/fatigue and weight loss.  HENT: Negative for hearing loss and sinus pain.   Eyes: Negative for blurred vision, double vision and photophobia.  Respiratory: Negative for cough and shortness of breath.   Cardiovascular: Negative for chest pain, palpitations and leg swelling.  Gastrointestinal: Negative for abdominal pain, nausea and vomiting.  Genitourinary: Negative for flank pain.    Musculoskeletal: Positive for back pain. Negative for myalgias.  Skin: Negative for itching and rash.  Neurological: Negative for tremors, focal weakness and weakness.  Endo/Heme/Allergies: Negative.   Psychiatric/Behavioral: Negative for depression.  All other systems reviewed and are negative.  Otherwise per HPI.  Assessment & Plan: Visit Diagnoses:  1. Spondylosis without myelopathy or radiculopathy, lumbar region   2. Chronic bilateral low back pain without sciatica   3. Post laminectomy syndrome     Plan: Findings:  Worsening now chronic axial low back pain left more than right but bilateral with catching sensation going from sit to stand or flexion to extension consistent with facet mediated pain.  MRI was obtained and reviewed below.  She has grade 2 to grade 3 listhesis of L5 on S1 with lateral fusion.  Central canal is decompressed but there is atrophic L5 nerve roots bilaterally but she is not having any leg pain at this point and no real focal weakness.  She likely has chronic pars defects at this level.  Above the level of fusion she has significant facet arthropathy with gaping facet joints.  There is also facet joint cyst.  There is no central canal stenosis but there is left-sided foraminal narrowing at this level due to disc bulging as well as facet cyst.  She has moderate facet arthropathy at L3-4.  I discussed with her at length about gaping facet joints and this may be the start  of some slippage at this level and instability.  Currently there is no stenosis.  I think the best approach is intra-articular facet joint blocks diagnostically and therapeutically at L4-5.  We could look at radiofrequency ablation.  Alternatively she may need to see a spine surgeon for evaluation but this is something she really does not look forward to doing at age 41.    Meds & Orders: No orders of the defined types were placed in this encounter.  No orders of the defined types were placed in this  encounter.   Follow-up: Return for Bilateral L4-5 facet joint block.   Procedures: No procedures performed  No notes on file   Clinical History: MRI LUMBAR SPINE WITHOUT CONTRAST  TECHNIQUE: Multiplanar, multisequence MR imaging of the lumbar spine was performed. No intravenous contrast was administered.  COMPARISON:  Abdominal CT 12/15/2016  FINDINGS: Segmentation:  Transitional S1 vertebra based on the lowest ribs.  Alignment: Grade 2/3 anterolisthesis at L5-S1 where there was either severe facet arthropathy or chronic pars defects, status post posterior decompression and posterior fusion.  Vertebrae: No fracture, evidence of discitis, or bone lesion. Bone harvest site in the posterior left ilium that is partially covered.  Conus medullaris and cauda equina: Conus extends to the L1 level. Conus and cauda equina appear normal.  Paraspinal and other soft tissues: Postoperative fatty atrophy of back muscles at L4 and L5.  Disc levels:  T12- L1: Unremarkable.  L1-L2: Borderline facet spurring.  Negative disc.  L2-L3: Mild disc narrowing and bulging. Mild facet spurring. No impingement  L3-L4: Moderate facet arthropathy with joint distortion and trace joint fluid. Mild disc bulging. Mild spinal stenosis.  L4-L5: Facet arthropathy with severe joint distortion. Both facets are gaping with effusion. The disc is narrowed and bulging. There is a synovial cyst in the upper left foramen measuring 16 mm, with left foraminal impingement exacerbated by disc bulging. Distorted but patent spinal canal.  L5-S1:Posterior decompression. Fusion of anterolisthesis. The foramina are distorted there is bilateral L5 flattening and atrophy.  IMPRESSION: 1. L5-S1 grade 2/3 anterolisthesis with posterolateral solid arthrodesis and interbody ankylosis. Bilateral foraminal stenosis with flattening of the atrophic L5 nerve roots. Patent spinal canal after posterior  decompression. 2. L4-5 severe facet arthropathy with effusions in the gaping joints. Disc narrowing and bulging causes foraminal stenosis, accentuated on the left by a 8 mm synovial cyst. 3. L3-4 moderate facet arthropathy.   Electronically Signed   By: Marnee Spring M.D.   On: 09/21/2017 14:33  She reports that  has never smoked. she has never used smokeless tobacco. No results for input(s): HGBA1C, LABURIC in the last 8760 hours.  Objective:  VS:  HT:    WT:   BMI:     BP:(!) 158/72  HR:73bpm  TEMP: ( )  RESP:  Physical Exam  Constitutional: She is oriented to person, place, and time. She appears well-developed and well-nourished.  Eyes: Conjunctivae and EOM are normal. Pupils are equal, round, and reactive to light.  Cardiovascular: Normal rate and intact distal pulses.  Pulmonary/Chest: Effort normal.  Musculoskeletal:  Patient ambulates without aid.  She is slow to rise from a seated position and does have pain with extension rotation of the lumbar spine left more than right concordant with her complaints of pain.  She has no pain over the greater trochanters and no pain with hip rotation.  She has good distal strength.  Neurological: She is alert and oriented to person, place, and time. She exhibits normal  muscle tone. Coordination normal.  Skin: Skin is warm and dry. No rash noted. No erythema.  Psychiatric: She has a normal mood and affect. Her behavior is normal.  Nursing note and vitals reviewed.   Ortho Exam Imaging: No results found.  Past Medical/Family/Surgical/Social History: Medications & Allergies reviewed per EMR Patient Active Problem List   Diagnosis Date Noted  . Left sided abdominal pain 08/25/2017  . Migraine without aura and without status migrainosus, not intractable 04/04/2017  . Lumbar radiculopathy 09/01/2016  . Post laminectomy syndrome 09/01/2016  . Osteoporosis screening 07/07/2016  . Obesity (BMI 30.0-34.9) 07/07/2016  . Recurrent major  depressive disorder, in full remission (HCC) 07/07/2016  . Gastroesophageal reflux disease without esophagitis 11/27/2015  . Hyperlipidemia LDL goal <130 11/27/2015  . Vitamin D deficiency 11/27/2015  . Acute gastritis 10/06/2015  . Acute bronchiolitis with bronchospasm 09/23/2015   Past Medical History:  Diagnosis Date  . Arthritis   . Hypertension    History reviewed. No pertinent family history. History reviewed. No pertinent surgical history. Social History   Occupational History  . Not on file  Tobacco Use  . Smoking status: Never Smoker  . Smokeless tobacco: Never Used  Substance and Sexual Activity  . Alcohol use: No    Alcohol/week: 0.0 oz  . Drug use: No  . Sexual activity: Not on file

## 2017-10-31 NOTE — Progress Notes (Deleted)
Pt states pain in lower back. Pt states pain has been there since October 2018. Pt states last injection made the pain better. Pt states normal activities makes pain worse, medication makes pain better. +Driver, +BT(plavix), -Dye Allergies.

## 2017-12-12 IMAGING — MR MR LUMBAR SPINE W/O CM
4 of 5 series · 19 of 48 positions shown · non-contrast
Comparison: Abdominal CT 12/15/2016

CLINICAL DATA: Lower back pain mainly on the left.

EXAM:
MRI LUMBAR SPINE WITHOUT CONTRAST
TECHNIQUE: Multiplanar, multisequence MR imaging of the lumbar spine was
performed. No intravenous contrast was administered.

[Series 6: T2 · sagittal · 4.0mm · 0.88mm/px · 6 of 15 slices shown (1 of 2)]
[im 1/15]
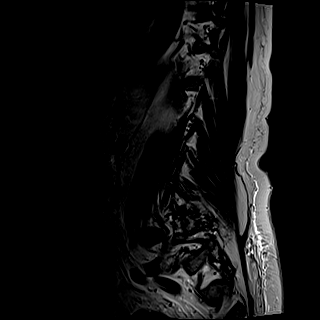
[im 3/15]
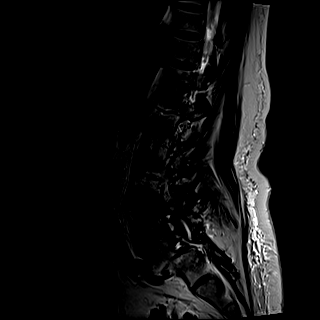
[im 6/15]
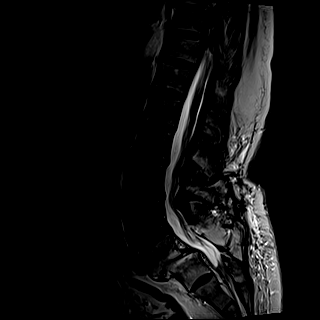
[im 9/15]
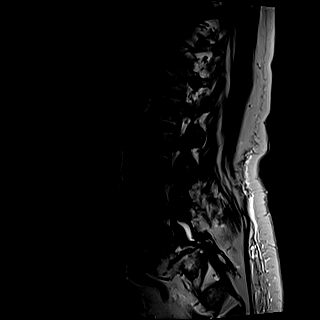
[im 12/15]
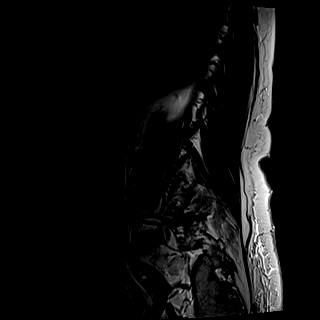
[im 15/15]
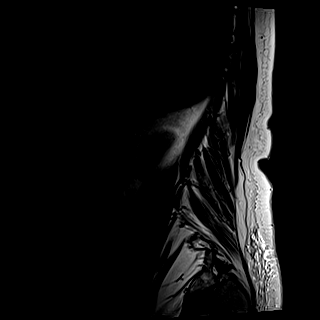

[Series 7: T1 · sagittal · 4.0mm · 0.88mm/px · 3 of 15 slices shown (1 of 2)]
[im 3/15]
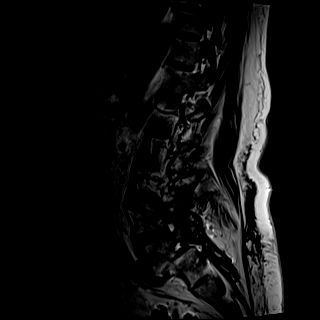
[im 9/15]
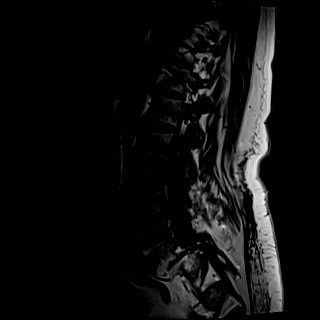
[im 15/15]
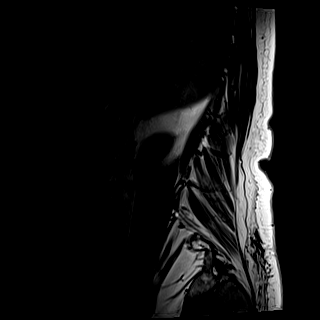

[Series 13: T2 · axial · 4.0mm · 0.28mm/px · z∈[-74,+109]mm · 7 of 41 slices shown (2 of 2)]
[im 1/41]
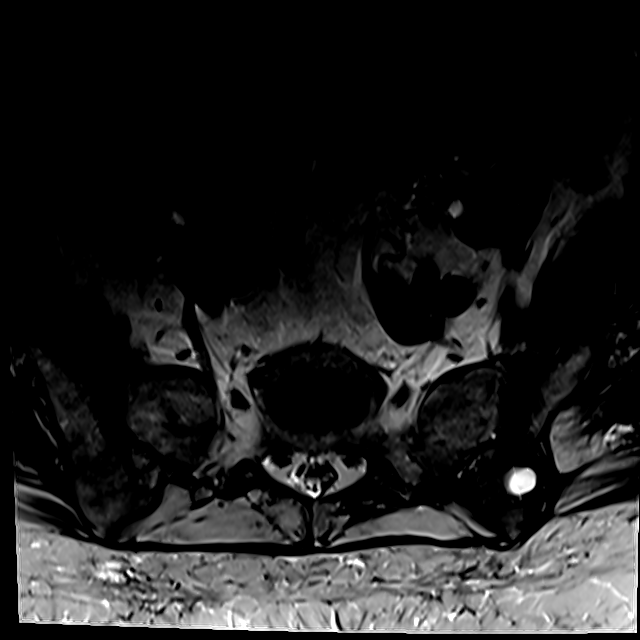
[im 6/41]
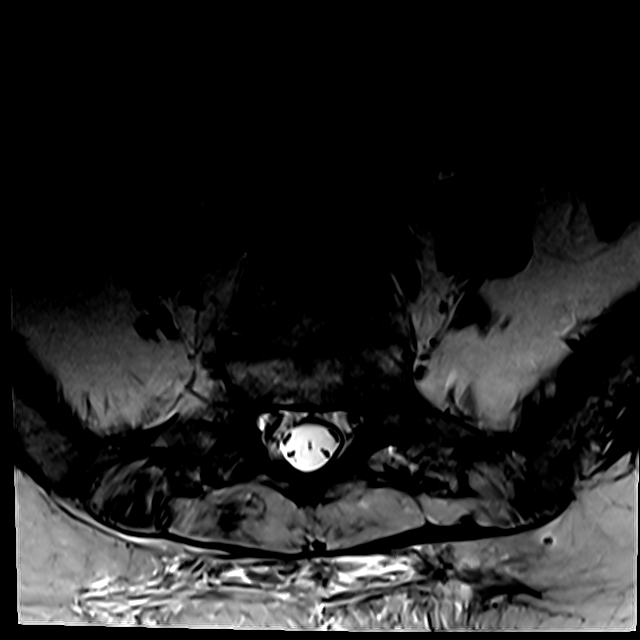
[im 12/41]
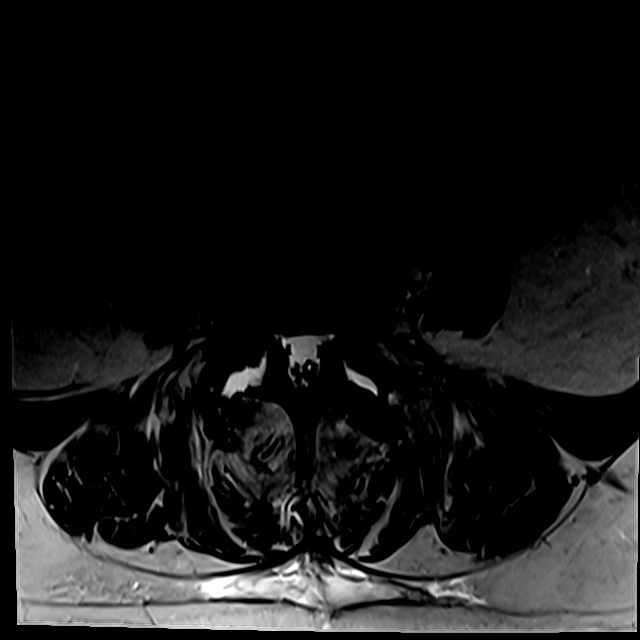
[im 18/41]
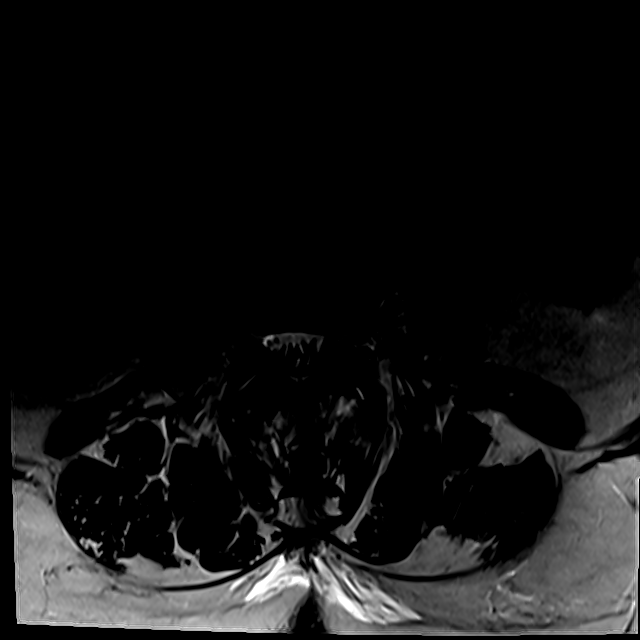
[im 21/41]
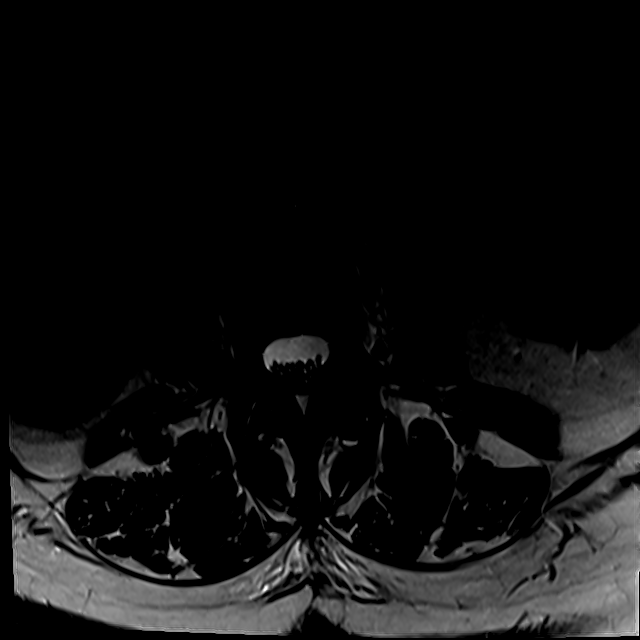
[im 23/41]
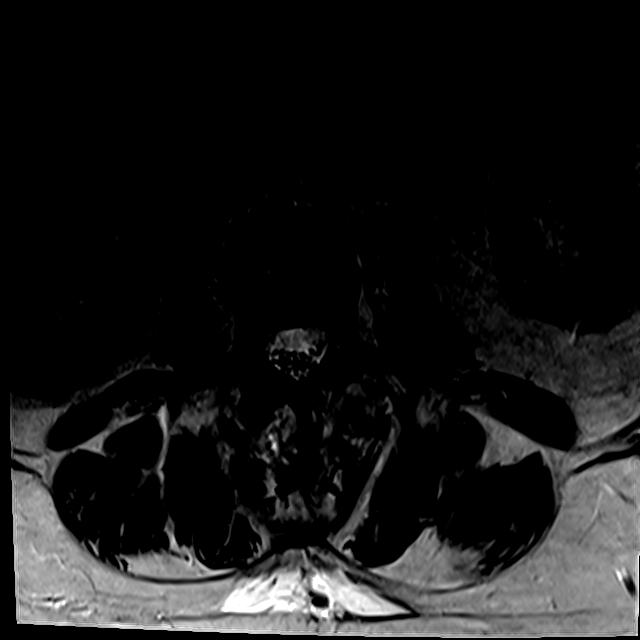
[im 35/41]
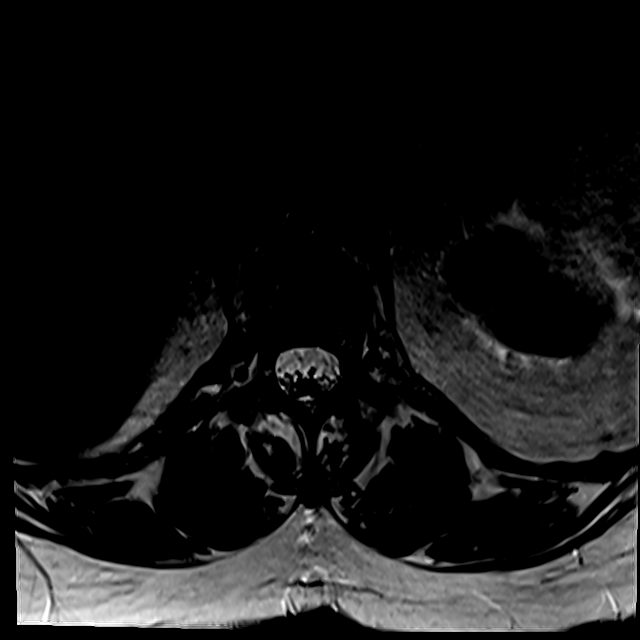

[Series 100: T1 · axial · 4.0mm · 0.28mm/px · z∈[-50,+109]mm · 3 of 41 slices shown (2 of 2)]
[im 6/41]
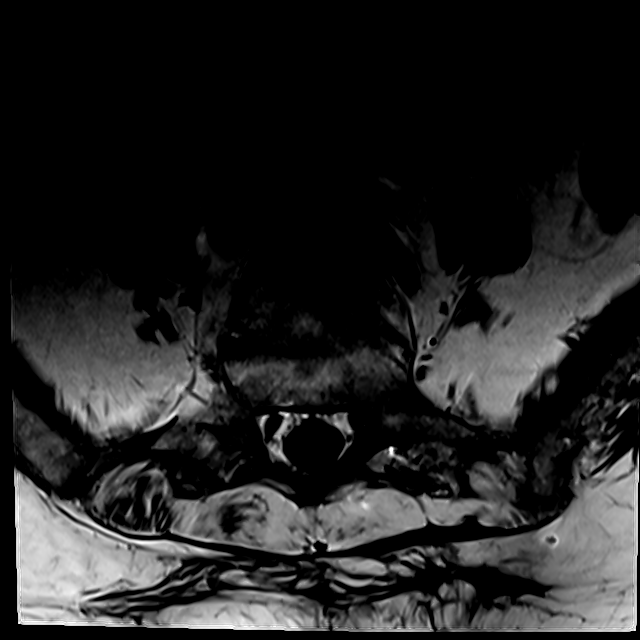
[im 21/41]
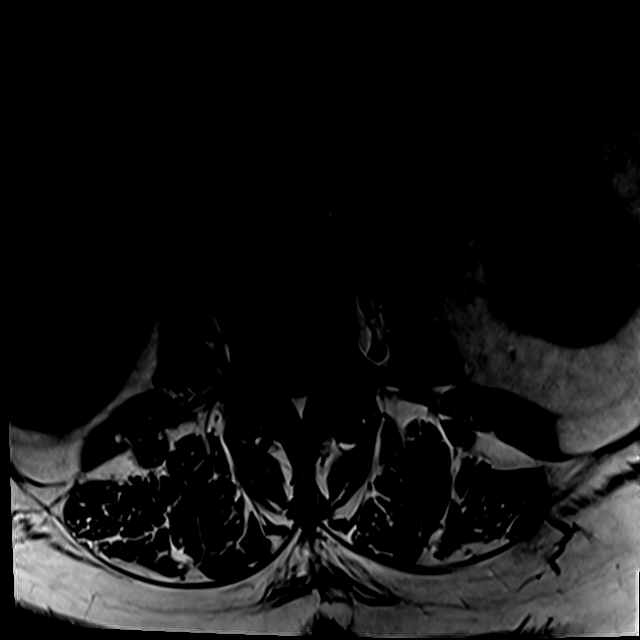
[im 35/41]
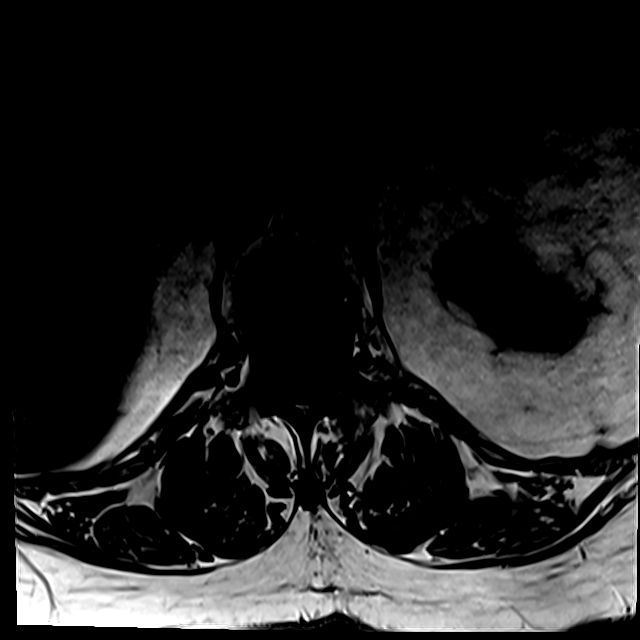

[19 of 48 positions shown; findings below may reference images not displayed]

FINDINGS: Segmentation:  Transitional S1 vertebra based on the lowest ribs.

Alignment: Grade [DATE] anterolisthesis at L5-S1 where there was either
severe facet arthropathy or chronic pars defects, status post
posterior decompression and posterior fusion.

Vertebrae: No fracture, evidence of discitis, or bone lesion. Bone
harvest site in the posterior left ilium that is partially covered.

Conus medullaris and cauda equina: Conus extends to the L1 level.
Conus and cauda equina appear normal.

Paraspinal and other soft tissues: Postoperative fatty atrophy of
back muscles at L4 and L5.

Disc levels:

T12- L1: Unremarkable.

L1-L2: Borderline facet spurring.  Negative disc.

L2-L3: Mild disc narrowing and bulging. Mild facet spurring. No
impingement

L3-L4: Moderate facet arthropathy with joint distortion and trace
joint fluid. Mild disc bulging. Mild spinal stenosis.

L4-L5: Facet arthropathy with severe joint distortion. Both facets
are gaping with effusion. The disc is narrowed and bulging. There is
a synovial cyst in the upper left foramen measuring 16 mm, with left
foraminal impingement exacerbated by disc bulging. Distorted but
patent spinal canal.

L5-S1:Posterior decompression. Fusion of anterolisthesis. The
foramina are distorted there is bilateral L5 flattening and atrophy.
IMPRESSION: 1. L5-S1 grade [DATE] anterolisthesis with posterolateral solid
arthrodesis and interbody ankylosis. Bilateral foraminal stenosis
with flattening of the atrophic L5 nerve roots. Patent spinal canal
after posterior decompression.
2. L4-5 severe facet arthropathy with effusions in the gaping
joints. Disc narrowing and bulging causes foraminal stenosis,
accentuated on the left by a 8 mm synovial cyst.
3. L3-4 moderate facet arthropathy.

## 2018-02-07 ENCOUNTER — Telehealth (INDEPENDENT_AMBULATORY_CARE_PROVIDER_SITE_OTHER): Payer: Self-pay | Admitting: *Deleted

## 2018-02-08 NOTE — Telephone Encounter (Signed)
Pt is scheduled for 02/23/18

## 2018-02-08 NOTE — Telephone Encounter (Signed)
Yes ok 

## 2018-02-23 ENCOUNTER — Encounter

## 2018-02-23 ENCOUNTER — Encounter (INDEPENDENT_AMBULATORY_CARE_PROVIDER_SITE_OTHER): Payer: Self-pay | Admitting: Physical Medicine and Rehabilitation

## 2018-02-23 ENCOUNTER — Ambulatory Visit (INDEPENDENT_AMBULATORY_CARE_PROVIDER_SITE_OTHER): Payer: Medicare Other | Admitting: Physical Medicine and Rehabilitation

## 2018-02-23 ENCOUNTER — Ambulatory Visit (INDEPENDENT_AMBULATORY_CARE_PROVIDER_SITE_OTHER): Payer: Medicare Other

## 2018-02-23 VITALS — BP 152/80 | HR 86

## 2018-02-23 DIAGNOSIS — M545 Low back pain: Secondary | ICD-10-CM

## 2018-02-23 DIAGNOSIS — M961 Postlaminectomy syndrome, not elsewhere classified: Secondary | ICD-10-CM | POA: Diagnosis not present

## 2018-02-23 DIAGNOSIS — M47816 Spondylosis without myelopathy or radiculopathy, lumbar region: Secondary | ICD-10-CM | POA: Diagnosis not present

## 2018-02-23 DIAGNOSIS — G8929 Other chronic pain: Secondary | ICD-10-CM

## 2018-02-23 MED ORDER — METHYLPREDNISOLONE ACETATE 80 MG/ML IJ SUSP
80.0000 mg | Freq: Once | INTRAMUSCULAR | Status: AC
  Administered 2018-02-23: 80 mg

## 2018-02-23 NOTE — Progress Notes (Signed)
 .  Numeric Pain Rating Scale and Functional Assessment Average Pain 5   In the last MONTH (on 0-10 scale) has pain interfered with the following?  1. General activity like being  able to carry out your everyday physical activities such as walking, climbing stairs, carrying groceries, or moving a chair?  Rating(3)   +Driver, +BT(plavix), -Dye Allergies.

## 2018-02-23 NOTE — Patient Instructions (Signed)

## 2018-03-09 NOTE — Procedures (Signed)
Lumbar Facet Joint Intra-Articular Injection(s) with Fluoroscopic Guidance  Patient: Elizabeth Kim      Date of Birth: 05-Feb-1941 MRN: [id number] PCP: Andreas Blower., MD      Visit Date: 02/23/2018   Universal Protocol:    Date/Time: 02/23/2018  Consent Given By: the patient  Position: PRONE   Additional Comments: Vital signs were monitored before and after the procedure. Patient was prepped and draped in the usual sterile fashion. The correct patient, procedure, and site was verified.   Injection Procedure Details:  Procedure Site One Meds Administered:  Meds ordered this encounter  Medications  . methylPREDNISolone acetate (DEPO-MEDROL) injection 80 mg     Laterality: Bilateral  Location/Site:  L4-L5  Needle size: 22 guage  Needle type: Spinal  Needle Placement: Articular  Findings:  -Comments: Excellent flow of contrast producing a partial arthrogram.  Procedure Details: The fluoroscope beam is vertically oriented in AP, and the inferior recess is visualized beneath the lower pole of the inferior apophyseal process, which represents the target point for needle insertion. When direct visualization is difficult the target point is located at the medial projection of the vertebral pedicle. The region overlying each aforementioned target is locally anesthetized with a 1 to 2 ml. volume of 1% Lidocaine without Epinephrine.   The spinal needle was inserted into each of the above mentioned facet joints using biplanar fluoroscopic guidance. A 0.25 to 0.5 ml. volume of Isovue-250 was injected and a partial facet joint arthrogram was obtained. A single spot film was obtained of the resulting arthrogram.    One to 1.25 ml of the steroid/anesthetic solution was then injected into each of the facet joints noted above.   Additional Comments:  The patient tolerated the procedure well Dressing: Band-Aid    Post-procedure details: Patient was observed during the  procedure. Post-procedure instructions were reviewed.  Patient left the clinic in stable condition.

## 2018-03-09 NOTE — Progress Notes (Signed)
Elizabeth Kim - 77 y.o. female MRN [id number]  Date of birth: 24-Jan-1941  Office Visit Note: Visit Date: 02/23/2018 PCP: Andreas Blower., MD Referred by: Andreas Blower., MD  Subjective: Chief Complaint  Patient presents with  . Lower Back - Pain  . Right Leg - Pain   HPI: Elizabeth Kim is a 77 year old female that comes in today with low back pain worsening over the last several months with some referral into the right leg but it is mainly the right thigh and it does not go past the knee.  She has no paresthesias or numbness.  She gets some worsening pain lying down and with driving.  She rates her pain is a 5 out of 10.  She denies any focal weakness.  She did have a fall in March and she feels like her pain really started after that time.  This was not a big fall but ever since then she has had worsening lower back pain.  The last time I saw her we completed facet joint blocks with good relief of her symptoms for quite some time.  MRI findings show significant facet arthropathy particularly at L4-5 without any significant central stenosis.  We are going to repeat the bilateral L4-5 facet joint blocks.   ROS Otherwise per HPI.  Assessment & Plan: Visit Diagnoses:  1. Spondylosis without myelopathy or radiculopathy, lumbar region   2. Chronic bilateral low back pain without sciatica   3. Post laminectomy syndrome     Plan: No additional findings.   Meds & Orders:  Meds ordered this encounter  Medications  . methylPREDNISolone acetate (DEPO-MEDROL) injection 80 mg    Orders Placed This Encounter  Procedures  . Facet Injection  . XR C-ARM NO REPORT    Follow-up: Return in about 1 month (around 03/23/2018) for Recheck spine.   Procedures: No procedures performed  Lumbar Facet Joint Intra-Articular Injection(s) with Fluoroscopic Guidance  Patient: Elizabeth Kim      Date of Birth: May 03, 1941 MRN: [id number] PCP: Andreas Blower., MD      Visit Date: 02/23/2018   Universal  Protocol:    Date/Time: 02/23/2018  Consent Given By: the patient  Position: PRONE   Additional Comments: Vital signs were monitored before and after the procedure. Patient was prepped and draped in the usual sterile fashion. The correct patient, procedure, and site was verified.   Injection Procedure Details:  Procedure Site One Meds Administered:  Meds ordered this encounter  Medications  . methylPREDNISolone acetate (DEPO-MEDROL) injection 80 mg     Laterality: Bilateral  Location/Site:  L4-L5  Needle size: 22 guage  Needle type: Spinal  Needle Placement: Articular  Findings:  -Comments: Excellent flow of contrast producing a partial arthrogram.  Procedure Details: The fluoroscope beam is vertically oriented in AP, and the inferior recess is visualized beneath the lower pole of the inferior apophyseal process, which represents the target point for needle insertion. When direct visualization is difficult the target point is located at the medial projection of the vertebral pedicle. The region overlying each aforementioned target is locally anesthetized with a 1 to 2 ml. volume of 1% Lidocaine without Epinephrine.   The spinal needle was inserted into each of the above mentioned facet joints using biplanar fluoroscopic guidance. A 0.25 to 0.5 ml. volume of Isovue-250 was injected and a partial facet joint arthrogram was obtained. A single spot film was obtained of the resulting arthrogram.    One to 1.25 ml of  the steroid/anesthetic solution was then injected into each of the facet joints noted above.   Additional Comments:  The patient tolerated the procedure well Dressing: Band-Aid    Post-procedure details: Patient was observed during the procedure. Post-procedure instructions were reviewed.  Patient left the clinic in stable condition.    Clinical History: MRI LUMBAR SPINE WITHOUT CONTRAST  TECHNIQUE: Multiplanar, multisequence MR imaging of the lumbar  spine was performed. No intravenous contrast was administered.  COMPARISON:  Abdominal CT 12/15/2016  FINDINGS: Segmentation:  Transitional S1 vertebra based on the lowest ribs.  Alignment: Grade 2/3 anterolisthesis at L5-S1 where there was either severe facet arthropathy or chronic pars defects, status post posterior decompression and posterior fusion.  Vertebrae: No fracture, evidence of discitis, or bone lesion. Bone harvest site in the posterior left ilium that is partially covered.  Conus medullaris and cauda equina: Conus extends to the L1 level. Conus and cauda equina appear normal.  Paraspinal and other soft tissues: Postoperative fatty atrophy of back muscles at L4 and L5.  Disc levels:  T12- L1: Unremarkable.  L1-L2: Borderline facet spurring.  Negative disc.  L2-L3: Mild disc narrowing and bulging. Mild facet spurring. No impingement  L3-L4: Moderate facet arthropathy with joint distortion and trace joint fluid. Mild disc bulging. Mild spinal stenosis.  L4-L5: Facet arthropathy with severe joint distortion. Both facets are gaping with effusion. The disc is narrowed and bulging. There is a synovial cyst in the upper left foramen measuring 16 mm, with left foraminal impingement exacerbated by disc bulging. Distorted but patent spinal canal.  L5-S1:Posterior decompression. Fusion of anterolisthesis. The foramina are distorted there is bilateral L5 flattening and atrophy.  IMPRESSION: 1. L5-S1 grade 2/3 anterolisthesis with posterolateral solid arthrodesis and interbody ankylosis. Bilateral foraminal stenosis with flattening of the atrophic L5 nerve roots. Patent spinal canal after posterior decompression. 2. L4-5 severe facet arthropathy with effusions in the gaping joints. Disc narrowing and bulging causes foraminal stenosis, accentuated on the left by a 8 mm synovial cyst. 3. L3-4 moderate facet arthropathy.   Electronically Signed   By:  Marnee Spring M.D.   On: 09/21/2017 14:33   She reports that she has never smoked. She has never used smokeless tobacco. No results for input(s): HGBA1C, LABURIC in the last 8760 hours.  Objective:  VS:  HT:    WT:   BMI:     BP:(!) 152/80  HR:86bpm  TEMP: ( )  RESP:  Physical Exam  Ortho Exam Imaging: No results found.  Past Medical/Family/Surgical/Social History: Medications & Allergies reviewed per EMR, new medications updated. Patient Active Problem List   Diagnosis Date Noted  . Left sided abdominal pain 08/25/2017  . Migraine without aura and without status migrainosus, not intractable 04/04/2017  . Lumbar radiculopathy 09/01/2016  . Post laminectomy syndrome 09/01/2016  . Osteoporosis screening 07/07/2016  . Obesity (BMI 30.0-34.9) 07/07/2016  . Recurrent major depressive disorder, in full remission (HCC) 07/07/2016  . Gastroesophageal reflux disease without esophagitis 11/27/2015  . Hyperlipidemia LDL goal <130 11/27/2015  . Vitamin D deficiency 11/27/2015  . Acute gastritis 10/06/2015  . Acute bronchiolitis with bronchospasm 09/23/2015   Past Medical History:  Diagnosis Date  . Arthritis   . Hypertension    History reviewed. No pertinent family history. History reviewed. No pertinent surgical history. Social History   Occupational History  . Not on file  Tobacco Use  . Smoking status: Never Smoker  . Smokeless tobacco: Never Used  Substance and Sexual Activity  . Alcohol  use: No    Alcohol/week: 0.0 oz  . Drug use: No  . Sexual activity: Not on file

## 2018-03-29 ENCOUNTER — Ambulatory Visit (INDEPENDENT_AMBULATORY_CARE_PROVIDER_SITE_OTHER): Payer: Self-pay | Admitting: Physical Medicine and Rehabilitation

## 2019-02-26 DIAGNOSIS — M6530 Trigger finger, unspecified finger: Secondary | ICD-10-CM | POA: Insufficient documentation

## 2019-06-12 DIAGNOSIS — R400 Somnolence: Secondary | ICD-10-CM | POA: Insufficient documentation

## 2019-07-11 DIAGNOSIS — M79642 Pain in left hand: Secondary | ICD-10-CM | POA: Insufficient documentation

## 2019-09-04 ENCOUNTER — Ambulatory Visit: Payer: Medicare Other | Admitting: Podiatry

## 2019-10-01 ENCOUNTER — Ambulatory Visit (INDEPENDENT_AMBULATORY_CARE_PROVIDER_SITE_OTHER): Payer: Medicare Other | Admitting: Podiatry

## 2019-10-01 ENCOUNTER — Encounter: Payer: Self-pay | Admitting: Podiatry

## 2019-10-01 ENCOUNTER — Other Ambulatory Visit: Payer: Self-pay

## 2019-10-01 ENCOUNTER — Ambulatory Visit (INDEPENDENT_AMBULATORY_CARE_PROVIDER_SITE_OTHER): Payer: Medicare Other

## 2019-10-01 VITALS — BP 116/59

## 2019-10-01 DIAGNOSIS — M79671 Pain in right foot: Secondary | ICD-10-CM

## 2019-10-01 DIAGNOSIS — M722 Plantar fascial fibromatosis: Secondary | ICD-10-CM

## 2019-10-01 MED ORDER — DICLOFENAC SODIUM 1 % EX GEL: 2.0000 g | Freq: Four times a day (QID) | CUTANEOUS | 2 refills | Status: AC

## 2019-10-01 NOTE — Patient Instructions (Signed)

## 2019-10-10 ENCOUNTER — Other Ambulatory Visit: Payer: Self-pay | Admitting: Podiatry

## 2019-10-10 DIAGNOSIS — M722 Plantar fascial fibromatosis: Secondary | ICD-10-CM

## 2019-10-10 NOTE — Progress Notes (Signed)
Subjective:   Patient ID: Elizabeth Kim, female   DOB: 78 y.o.   MRN: [id number]   HPI 78 year old female presents the office today for concerns of pain to her right foot located to the arch of the foot as well as the medial aspect.  She has discomfort going to the heels and on the last 3 to 4 months.  She denies any recent injury or trauma.  She has tried inserts as well as different shoes.  Pain is worse after walking and being on her feet.  No radiating pain or weakness.  No falls.  Review of Systems  All other systems reviewed and are negative.  Past Medical History:  Diagnosis Date  . Arthritis   . Hypertension     No past surgical history on file.   Current Outpatient Medications:  .  ALPRAZolam (XANAX) 0.5 MG tablet, , Disp: , Rfl: 3 .  ALPRAZolam (XANAX) 0.5 MG tablet, Take 0.5 mg by mouth., Disp: , Rfl:  .  amoxicillin-clavulanate (AUGMENTIN) 875-125 MG tablet, amoxicillin 875 mg-potassium clavulanate 125 mg tablet  Take 1 tablet every 12 hours by oral route with meals for 10 days., Disp: , Rfl:  .  atenolol (TENORMIN) 50 MG tablet, Take 50 mg by mouth., Disp: , Rfl:  .  azelastine (ASTELIN) 0.1 % nasal spray, azelastine 137 mcg (0.1 %) nasal spray aerosol  SPRAY 2 SPRAYS INTO EACH NOSTRIL TWICE A DAY, Disp: , Rfl:  .  azithromycin (ZITHROMAX) 250 MG tablet, , Disp: , Rfl: 0 .  brimonidine (ALPHAGAN) 0.15 % ophthalmic solution, , Disp: , Rfl: 1 .  cholestyramine (QUESTRAN) 4 GM/DOSE powder, , Disp: , Rfl: 3 .  cholestyramine (QUESTRAN) 4 GM/DOSE powder, MIX 4 GRAMS AND TAKE AS DIRECTED (MAXIMUM OF 2 CUPS PER DAY), Disp: , Rfl:  .  ciprofloxacin (CIPRO) 750 MG tablet, , Disp: , Rfl: 0 .  clopidogrel (PLAVIX) 75 MG tablet, , Disp: , Rfl: 3 .  diclofenac Sodium (VOLTAREN) 1 % GEL, diclofenac 1 % topical gel, Disp: , Rfl:  .  doxazosin (CARDURA) 4 MG tablet, Take 4 mg daily by mouth., Disp: , Rfl: 1 .  escitalopram (LEXAPRO) 5 MG tablet, , Disp: , Rfl: 1 .  escitalopram  (LEXAPRO) 5 MG tablet, Take 1 tablet by mouth once daily, Disp: , Rfl:  .  furosemide (LASIX) 20 MG tablet, Take 1 tablet by mouth on Monday, Wednesday, and Friday., Disp: , Rfl:  .  hydrALAZINE (APRESOLINE) 25 MG tablet, , Disp: , Rfl: 3 .  hydrochlorothiazide (HYDRODIURIL) 25 MG tablet, , Disp: , Rfl: 3 .  hydrochlorothiazide (HYDRODIURIL) 25 MG tablet, , Disp: , Rfl:  .  HYDROcodone-acetaminophen (NORCO/VICODIN) 5-325 MG per tablet, , Disp: , Rfl: 0 .  hyoscyamine (LEVSIN SL) 0.125 MG SL tablet, , Disp: , Rfl: 1 .  losartan (COZAAR) 100 MG tablet, , Disp: , Rfl: 11 .  losartan (COZAAR) 100 MG tablet, TAKE 1 TABLET BY MOUTH EVERY DAY, Disp: , Rfl:  .  metoprolol succinate (TOPROL-XL) 100 MG 24 hr tablet, , Disp: , Rfl: 11 .  metroNIDAZOLE (FLAGYL) 500 MG tablet, , Disp: , Rfl: 0 .  omeprazole (PRILOSEC) 40 MG capsule, , Disp: , Rfl: 1 .  PredniSONE 10 MG KIT, Take as instructed for 6 days and a tapering dose, 6, 5, 4. Per day until gone, Disp: 21 each, Rfl: 0 .  torsemide (DEMADEX) 20 MG tablet, torsemide 20 mg tablet  TAKE 2 TABLETS BY MOUTH EVERY  DAY, Disp: , Rfl:  .  triamcinolone cream (KENALOG) 0.1 %, , Disp: , Rfl: 0 .  Vitamin D, Ergocalciferol, (DRISDOL) 50000 UNITS CAPS capsule, , Disp: , Rfl: 0 .  diclofenac Sodium (VOLTAREN) 1 % GEL, Apply 2 g topically 4 (four) times daily. Rub into affected area of foot 2 to 4 times daily, Disp: 100 g, Rfl: 2  Allergies  Allergen Reactions  . Bee Pollen   . Bystolic [Nebivolol Hcl]   . Norvasc [Amlodipine Besylate]   . Bee Venom Rash         Objective:  Physical Exam  General: AAO x3, NAD  Dermatological: Skin is warm, dry and supple bilateral. Nails x 10 are well manicured; remaining integument appears unremarkable at this time. There are no open sores, no preulcerative lesions, no rash or signs of infection present.  Vascular: Dorsalis Pedis artery and Posterior Tibial artery pedal pulses are 2/4 bilateral with immedate capillary  fill time. Pedal hair growth present. No varicosities and no lower extremity edema present bilateral. There is no pain with calf compression, swelling, warmth, erythema.   Neruologic: Grossly intact via light touch bilateral.  Protective threshold with Semmes Wienstein monofilament intact to all pedal sites bilateral.  Negative Tinel sign.  Musculoskeletal: There is tenderness palpation along the medial band plantar fashion the arch of the foot as well as the plantar medial aspect of the heel.  There is no pain with lateral compression of calcaneus.  No pain with Achilles tendon.  Minimal discomfort to the flexor tendons on medial aspect ankle overall the tendons appear to be intact.  No area pinpoint tenderness.  Muscular strength 5/5 in all groups tested bilateral.  Gait: Unassisted, Nonantalgic.       Assessment:   Right heel pain/arch pain, plantar fasciitis     Plan:  -Treatment options discussed including all alternatives, risks, and complications -Etiology of symptoms were discussed -X-rays were obtained and reviewed with the patient. No evidence of acute fracture or stress fracture -Steroid injection performed.  See procedure note below. -Voltaren gel -Discussed stretching, icing daily. -Discussed shoe modifications and orthotics  Procedure: Injection Tendon/Ligament Discussed alternatives, risks, complications and verbal consent was obtained.  Location: Right plantar fascia at the glabrous junction; medial approach. Skin Prep: Alcohol. Injectate: 0.5cc 0.5% marcaine plain, 0.5 cc 2% lidocaine plain and, 1 cc kenalog 10. Disposition: Patient tolerated procedure well. Injection site dressed with a band-aid.  Post-injection care was discussed and return precautions discussed.   Return in about 4 weeks (around 10/29/2019), or if symptoms worsen or fail to improve.  Trula Slade DPM

## 2019-10-25 DIAGNOSIS — J9690 Respiratory failure, unspecified, unspecified whether with hypoxia or hypercapnia: Secondary | ICD-10-CM | POA: Insufficient documentation

## 2019-10-28 DIAGNOSIS — J984 Other disorders of lung: Secondary | ICD-10-CM | POA: Insufficient documentation

## 2019-10-29 ENCOUNTER — Ambulatory Visit: Payer: Self-pay | Admitting: Podiatry

## 2019-12-13 DIAGNOSIS — D519 Vitamin B12 deficiency anemia, unspecified: Secondary | ICD-10-CM | POA: Insufficient documentation

## 2020-07-08 DIAGNOSIS — Z8673 Personal history of transient ischemic attack (TIA), and cerebral infarction without residual deficits: Secondary | ICD-10-CM | POA: Insufficient documentation

## 2021-02-09 ENCOUNTER — Other Ambulatory Visit: Payer: Self-pay

## 2021-02-09 ENCOUNTER — Encounter: Payer: Self-pay | Admitting: Podiatry

## 2021-02-09 ENCOUNTER — Ambulatory Visit (INDEPENDENT_AMBULATORY_CARE_PROVIDER_SITE_OTHER): Payer: Medicare Other | Admitting: Podiatry

## 2021-02-09 DIAGNOSIS — M79671 Pain in right foot: Secondary | ICD-10-CM | POA: Diagnosis not present

## 2021-02-09 DIAGNOSIS — M722 Plantar fascial fibromatosis: Secondary | ICD-10-CM

## 2021-02-09 MED ORDER — TRIAMCINOLONE ACETONIDE 10 MG/ML IJ SUSP
10.0000 mg | Freq: Once | INTRAMUSCULAR | Status: AC
  Administered 2021-02-09: 10 mg

## 2021-02-09 NOTE — Patient Instructions (Signed)

## 2021-02-13 NOTE — Progress Notes (Signed)
Subjective: 80 year old female presents the office today for concerns of pain to the right arch.  She states that the arches starting her.  She recently get new shoes.  She thinks her inserts may need to be adjusted.  Is been ongoing about 2 months.  Left has been doing without any issues. Denies any systemic complaints such as fevers, chills, nausea, vomiting. No acute changes since last appointment, and no other complaints at this time.   Objective: AAO x3, NAD DP/PT pulses palpable bilaterally, CRT less than 3 seconds Mild tenderness on medial band plantar fashion of the arch of the right foot.  There is no area of pinpoint tenderness there is no edema, erythema.  No pain the Achilles tendon.  There is no area of pinpoint tenderness.  MMT 5/5.  No pain with calf compression, swelling, warmth, erythema  Assessment: Right arch pain, plantar fasciitis  Plan: -All treatment options discussed with the patient including all alternatives, risks, complications.  -X-rays obtained and reviewed.  There is no evidence of acute fracture or stress fracture. -I do think this is more coming from muscle, plantar fasciitis in the arch of the foot.  I want her to continue with stretching, icing daily.  The shell of her orthotics seems to be fitting well but the top-cover is worn out.  We are going to send this back for recovering.  We did do a bladder top-cover and she liked that better. -Patient encouraged to call the office with any questions, concerns, change in symptoms.   Vivi Barrack DPM

## 2021-02-19 ENCOUNTER — Telehealth: Payer: Self-pay | Admitting: Podiatry

## 2021-02-19 NOTE — Telephone Encounter (Signed)
Refurbished orthotics in... lvm for pt ok to pick up. 

## 2021-03-09 ENCOUNTER — Ambulatory Visit: Payer: Medicare Other | Admitting: Podiatry

## 2021-07-06 ENCOUNTER — Ambulatory Visit (INDEPENDENT_AMBULATORY_CARE_PROVIDER_SITE_OTHER): Payer: Medicare Other | Admitting: Podiatry

## 2021-07-06 ENCOUNTER — Other Ambulatory Visit: Payer: Self-pay

## 2021-07-06 DIAGNOSIS — M722 Plantar fascial fibromatosis: Secondary | ICD-10-CM

## 2021-07-06 DIAGNOSIS — M79671 Pain in right foot: Secondary | ICD-10-CM

## 2021-07-06 NOTE — Patient Instructions (Addendum)

## 2021-07-08 NOTE — Progress Notes (Signed)
Subjective: 80 year old female presents the office today for concerns of continued pain to the right heel.  She states that after her last appointment she did have increased pain for about a week and the pain to subside the pain went away for some time before it started to come back.  She did bring her orthotics me to take a look at as well.  No recent injury or trauma any changes otherwise.  Objective: AAO x3, NAD DP/PT pulses palpable bilaterally, CRT less than 3 seconds There is tenderness palpation on plantar medial tubercle of the calcaneus insertional plantar fashion just distal to the insertion.  Mild discomfort the arch of the foot.  There is no significant discomfort to the ankle.  There is no pain with Achilles tendon or calf muscle.  There is no area of pinpoint tenderness.  MMT 5/5.  Negative Tinel sign.  No open lesions or pre-ulcerative lesions.  No pain with calf compression, swelling, warmth, erythema  Assessment: 80 year old female with right heel pain, Planter fasciitis  Plan: -All treatment options discussed with the patient including all alternatives, risks, complications.  -Steroid injection was performed today.  Skin was cleaned with alcohol and 1 cc dexamethasone phosphate, 1 cc lidocaine plain was infiltrated on the area maximal tenderness on plantar medial tubercle of the calcaneus at the insertion plantar fascial. She tolerated the procedure well. -Reviewed the orthotics.  Discussed continuing stretching, icing daily as well as wearing shoes and good arch support. -Patient encouraged to call the office with any questions, concerns, change in symptoms.   Vivi Barrack DPM

## 2021-08-14 ENCOUNTER — Ambulatory Visit: Payer: Medicare Other | Admitting: Podiatry

## 2021-08-21 ENCOUNTER — Ambulatory Visit (INDEPENDENT_AMBULATORY_CARE_PROVIDER_SITE_OTHER): Payer: Medicare Other | Admitting: Podiatry

## 2021-08-21 ENCOUNTER — Other Ambulatory Visit: Payer: Self-pay

## 2021-08-21 DIAGNOSIS — M722 Plantar fascial fibromatosis: Secondary | ICD-10-CM | POA: Diagnosis not present

## 2021-08-21 MED ORDER — TRIAMCINOLONE ACETONIDE 10 MG/ML IJ SUSP
10.0000 mg | Freq: Once | INTRAMUSCULAR | Status: AC
Start: 2021-08-21 — End: 2021-08-21
  Administered 2021-08-21: 10 mg

## 2021-08-24 DIAGNOSIS — M722 Plantar fascial fibromatosis: Secondary | ICD-10-CM | POA: Insufficient documentation

## 2021-08-24 NOTE — Progress Notes (Signed)
Subjective: 80 year old female presents the office today for concerns of continued pain to the right heel.  She states the last injection was not helpful.  She said that she still getting discomfort particular if she is on her feet a lot.  She is adequate max her pain level is 8/10.  No recent injury or trauma she reports.  No swelling.  She has no other concerns today.   Objective: AAO x3, NAD DP/PT pulses palpable bilaterally, CRT less than 3 seconds There is tenderness palpation on plantar medial tubercle of the calcaneus at the insertion of plantar fascia.  Today there is no discomfort the arch of the foot.  There is no significant discomfort to the ankle.  There is no pain with Achilles tendon or calf muscle.  There is no area of pinpoint tenderness.  MMT 5/5.  Negative Tinel sign.  No open lesions or pre-ulcerative lesions.  No pain with calf compression, swelling, warmth, erythema  Assessment: 80 year old female with right heel pain, plantar fasciitis  Plan: -All treatment options discussed with the patient including all alternatives, risks, complications.  -States she wishes to pursue another steroid injection.  Discussed risks of doing so and she understands and wishes to proceed.  Discussed that after this would like to hold off on injections for a while.  See procedure note below.  Cam boot was dispensed.  Continue stretching, icing daily.  No improvement refer to physical therapy or MRI.  Procedure: Injection Tendon/Ligament Discussed alternatives, risks, complications and verbal consent was obtained.  Location: Right plantar fascia at the glabrous junction; medial approach. Skin Prep: Alcohol. Injectate: 0.5cc 0.5% marcaine plain, 0.5 cc 2% lidocaine plain and, 1 cc kenalog 10. Disposition: Patient tolerated procedure well. Injection site dressed with a band-aid.  Post-injection care was discussed and return precautions discussed.   Return in about 4 weeks (around 09/18/2021) for  plantar fasciitis .  Vivi Barrack DPM

## 2021-09-28 ENCOUNTER — Ambulatory Visit: Payer: Medicare Other | Admitting: Podiatry

## 2023-01-31 ENCOUNTER — Other Ambulatory Visit: Payer: Self-pay | Admitting: Orthopedic Surgery

## 2023-01-31 DIAGNOSIS — M545 Low back pain, unspecified: Secondary | ICD-10-CM

## 2023-01-31 DIAGNOSIS — M79604 Pain in right leg: Secondary | ICD-10-CM

## 2023-06-27 ENCOUNTER — Ambulatory Visit: Payer: Medicare Other | Admitting: Podiatry

## 2023-06-28 ENCOUNTER — Ambulatory Visit (INDEPENDENT_AMBULATORY_CARE_PROVIDER_SITE_OTHER): Payer: Medicare Other | Admitting: Podiatry

## 2023-06-28 DIAGNOSIS — M79674 Pain in right toe(s): Secondary | ICD-10-CM | POA: Diagnosis not present

## 2023-06-28 DIAGNOSIS — M79675 Pain in left toe(s): Secondary | ICD-10-CM | POA: Diagnosis not present

## 2023-06-28 DIAGNOSIS — L600 Ingrowing nail: Secondary | ICD-10-CM

## 2023-06-28 DIAGNOSIS — B351 Tinea unguium: Secondary | ICD-10-CM | POA: Diagnosis not present

## 2023-06-28 MED ORDER — CEPHALEXIN 500 MG PO CAPS: 500.0000 mg | ORAL_CAPSULE | Freq: Three times a day (TID) | ORAL | 0 refills | Status: AC

## 2023-06-28 MED ORDER — MUPIROCIN 2 % EX OINT: 1.0000 | TOPICAL_OINTMENT | Freq: Two times a day (BID) | CUTANEOUS | 2 refills | Status: AC

## 2023-06-28 NOTE — Progress Notes (Signed)
Subjective: 82 year old female presents the office with above concerns.  She said that she had ingrown toenail for about a month ago on the left big toe.  She does not think it is infected but is tender.  She states that she is put this off as long as she can.  No drainage or pus that she reports.  She is currently undergoing chemotherapy.  She is not able to trim her nails herself as she cannot see them great.  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  Objective: AAO x3, NAD DP/PT pulses palpable bilaterally, CRT less than 3 seconds Nails are dystrophic and elongated not causing discomfort 1-5 bilaterally.  Left hallux toenail there is some localized edema erythema on the medial aspect of the toenail without any drainage or pus noted today there is no ascending cellulitis.  No fluctuance or crepitation there is no open lesions. No pain with calf compression, swelling, warmth, erythema  Assessment: Symptomatic onychosis, ingrown toenail left hallux  Plan: -All treatment options discussed with the patient including all alternatives, risks, complications.  -Regards to the ingrown toenail we discussed partial nail avulsion versus conservative treatment.  Will continue with conservative treatment for now.  There was to be the nail any complications or bleeding.  Discussed Epsom salt soaks.  Antibiotic ointment dressing changes daily.  Monitor for any signs or symptoms of infection.  If symptoms persist we will likely need to proceed with partial nail avulsion. -Debrided toenails times now any complications or bleeding. -Patient encouraged to call the office with any questions, concerns, change in symptoms.   Vivi Barrack DPM

## 2023-06-28 NOTE — Patient Instructions (Signed)

## 2023-07-12 ENCOUNTER — Ambulatory Visit: Payer: Medicare Other | Admitting: Podiatry

## 2023-09-21 ENCOUNTER — Other Ambulatory Visit: Payer: Self-pay

## 2023-09-27 ENCOUNTER — Ambulatory Visit: Payer: Medicare Other | Admitting: Podiatry

## 2023-10-07 ENCOUNTER — Ambulatory Visit (INDEPENDENT_AMBULATORY_CARE_PROVIDER_SITE_OTHER): Payer: Medicare Other | Admitting: Podiatry

## 2023-10-07 DIAGNOSIS — B351 Tinea unguium: Secondary | ICD-10-CM

## 2023-10-07 DIAGNOSIS — M79674 Pain in right toe(s): Secondary | ICD-10-CM | POA: Diagnosis not present

## 2023-10-07 DIAGNOSIS — M79675 Pain in left toe(s): Secondary | ICD-10-CM | POA: Diagnosis not present

## 2023-10-07 NOTE — Progress Notes (Unsigned)
       Subjective:  Patient ID: Elizabeth Kim, female    DOB: 07-04-1941,  MRN: [id number]  Elizabeth Kim presents to clinic today for:  Chief Complaint  Patient presents with   RFC     RFC  PLAVIX NOT DIABETIC   Patient notes nails are thick, discolored, elongated and painful in shoegear when trying to ambulate.  Patient notes she typically goes to the [place] office and sees Dr. Ardelle Anton.  She also notes that he cut back an ingrown toenail on the left great toe approximately 3 months ago.  She wanted to try this office since it is closer to her home.  She does request her hallux nail corners to be cut back.  PCP is Andreas Blower., MD.  Past Medical History:  Diagnosis Date   Arthritis    Hypertension    No past surgical history on file.  Allergies  Allergen Reactions   Bee Pollen    Bystolic [Nebivolol Hcl]    Norvasc [Amlodipine Besylate]    Bee Venom Rash    Review of Systems: Negative except as noted in the HPI.  Objective:  Elizabeth Kim is a pleasant 82 y.o. female in NAD. AAO x 3.  Vascular Examination: Capillary refill time is 3-5 seconds to toes bilateral. Palpable pedal pulses b/l LE. Digital hair present b/l.  Skin temperature gradient WNL b/l. No varicosities b/l. No cyanosis noted b/l.   Dermatological Examination: Pedal skin with normal turgor, texture and tone b/l. No open wounds. No interdigital macerations b/l. Toenails x10 are 3mm thick, discolored, dystrophic with subungual debris. There is pain with compression of the nail plates.  They are elongated x10. No evidence of ingrown toenails noted today.  Assessment/Plan: 1. Pain due to onychomycosis of toenails of both feet    The mycotic toenails were sharply debrided x10 with sterile nail nippers and a power debriding burr to decrease bulk/thickness and length.  The hallux nail corners were cut back.  The patient was carefully inspecting if this was done to her satisfaction at the end of the  appointment.  However the patient has issues with recurring ingrown toenails and is always needing the hallux corners cut back significantly, she will need to proceed to have permanent avulsion of the affected nail borders.  Return in about 9 weeks (around 12/09/2023) for RFC.   Clerance Lav, DPM, FACFAS [facility]     [address] [place], [place] [address]                Office [phone]  Fax [phone]

## 2023-12-07 ENCOUNTER — Ambulatory Visit (INDEPENDENT_AMBULATORY_CARE_PROVIDER_SITE_OTHER): Payer: Medicare Other | Admitting: Podiatry

## 2023-12-07 DIAGNOSIS — M79675 Pain in left toe(s): Secondary | ICD-10-CM

## 2023-12-07 DIAGNOSIS — B351 Tinea unguium: Secondary | ICD-10-CM

## 2023-12-07 DIAGNOSIS — M79674 Pain in right toe(s): Secondary | ICD-10-CM

## 2023-12-07 NOTE — Progress Notes (Signed)
       Subjective:  Patient ID: Elizabeth Kim, female    DOB: [birth year],  MRN: [id number]   Elizabeth Kim presents to clinic today for:  Chief Complaint  Patient presents with   RFC    RFC today with out callous. Not diabetic and takes Plavix   Patient notes nails are thick, discolored, elongated and painful in shoegear when trying to ambulate.    PCP is Andreas Blower., MD.  Past Medical History:  Diagnosis Date   Arthritis    Hypertension    No past surgical history on file.  Allergies  Allergen Reactions   Bee Pollen    Bystolic [Nebivolol Hcl]    Norvasc [Amlodipine Besylate]    Bee Venom Rash    Review of Systems: Negative except as noted in the HPI.  Objective:  Elizabeth Kim is a pleasant 83 y.o. female in NAD. AAO x 3.  Vascular Examination: Capillary refill time is 3-5 seconds to toes bilateral. Palpable pedal pulses b/l LE. Digital hair present b/l.  Skin temperature gradient WNL b/l. No varicosities b/l. No cyanosis noted b/l.   Dermatological Examination: Pedal skin with normal turgor, texture and tone b/l. No open wounds. No interdigital macerations b/l. Toenails x10 are 3mm thick, discolored, dystrophic with subungual debris. There is pain with compression of the nail plates.  They are elongated x10  Assessment/Plan: 1. Pain due to onychomycosis of toenails of both feet    The mycotic toenails were sharply debrided x10 with sterile nail nippers and a power debriding burr to decrease bulk/thickness and length.    Return in about 3 months (around 03/05/2024) for RFC.   Clerance Lav, DPM, FACFAS [facility]     [address] [place], [place] [address]                Office [phone]  Fax [phone]

## 2024-03-07 ENCOUNTER — Ambulatory Visit: Payer: Medicare Other | Admitting: Podiatry

## 2024-07-27 ENCOUNTER — Encounter: Payer: Self-pay | Admitting: Podiatry

## 2024-07-27 ENCOUNTER — Ambulatory Visit (INDEPENDENT_AMBULATORY_CARE_PROVIDER_SITE_OTHER): Admitting: Podiatry

## 2024-07-27 DIAGNOSIS — M79674 Pain in right toe(s): Secondary | ICD-10-CM | POA: Diagnosis not present

## 2024-07-27 DIAGNOSIS — B351 Tinea unguium: Secondary | ICD-10-CM | POA: Diagnosis not present

## 2024-07-27 DIAGNOSIS — M79675 Pain in left toe(s): Secondary | ICD-10-CM

## 2024-07-27 NOTE — Progress Notes (Signed)
       Subjective:  Patient ID: Elizabeth Kim, female    DOB: 05-20-1941,  MRN: [id number]   Elizabeth Kim presents to clinic today for:  Chief Complaint  Patient presents with   [facility]    RFC with out callous. Not diabetic Plavix   Patient notes nails are thick, discolored, elongated and painful in shoegear when trying to ambulate.    PCP is Delilah Murray HERO., MD.  Past Medical History:  Diagnosis Date   Arthritis    Hypertension    History reviewed. No pertinent surgical history.  Allergies  Allergen Reactions   Bee Pollen    Bystolic [Nebivolol Hcl]    Norvasc [Amlodipine Besylate]    Bee Venom Rash    Review of Systems: Negative except as noted in the HPI.  Objective:  Elizabeth Kim is a pleasant 83 y.o. female in NAD. AAO x 3.  Vascular Examination: Capillary refill time is 3-5 seconds to toes bilateral. Palpable pedal pulses b/l LE. Digital hair present b/l.  Skin temperature gradient WNL b/l. No varicosities b/l. No cyanosis noted b/l.   Dermatological Examination: Pedal skin with normal turgor, texture and tone b/l. No open wounds. No interdigital macerations b/l. Toenails x10 are 3mm thick, discolored, dystrophic with subungual debris. There is pain with compression of the nail plates.  They are elongated x10  Assessment/Plan: 1. Pain due to onychomycosis of toenails of both feet    #Onychomycosis with pain  -Nails palliatively debrided as below. -Educated on self-care - Anticoagulated on Plavix  Procedure: Nail Debridement Rationale: Pain Type of Debridement: manual, sharp debridement. Instrumentation: Nail nipper, rotary burr. Number of Nails: 10   Return in about 3 months (around 10/27/2024) for Routine Foot Care.   Ethan LITTIE Saddler, DPM, AACFAS [facility]     [address] [place], [place] [address]                Office [phone]  Fax [phone]

## 2024-10-31 ENCOUNTER — Ambulatory Visit: Admitting: Podiatry
